# Patient Record
Sex: Male | Born: 1970 | Race: Black or African American | Hispanic: No | Marital: Single | Smoking: Never smoker
Health system: Southern US, Community
[De-identification: ages and names within clinical notes are randomized; demographics above are authoritative.]

## PROBLEM LIST (undated history)

## (undated) DIAGNOSIS — E559 Vitamin D deficiency, unspecified: Secondary | ICD-10-CM

## (undated) DIAGNOSIS — E538 Deficiency of other specified B group vitamins: Secondary | ICD-10-CM

## (undated) DIAGNOSIS — F259 Schizoaffective disorder, unspecified: Secondary | ICD-10-CM

## (undated) DIAGNOSIS — F319 Bipolar disorder, unspecified: Secondary | ICD-10-CM

---

## 2013-03-05 ENCOUNTER — Inpatient Hospital Stay (HOSPITAL_COMMUNITY)
Admission: EM | Admit: 2013-03-05 | Discharge: 2013-03-06 | DRG: 313 | Disposition: A | Discharge status: Home / Self Care | Payer: Self-pay | Attending: Internal Medicine | Admitting: Internal Medicine

## 2013-03-05 ENCOUNTER — Encounter (HOSPITAL_COMMUNITY): Payer: Self-pay | Admitting: Emergency Medicine

## 2013-03-05 ENCOUNTER — Emergency Department (HOSPITAL_COMMUNITY): Payer: Self-pay

## 2013-03-05 DIAGNOSIS — I4510 Unspecified right bundle-branch block: Secondary | ICD-10-CM | POA: Diagnosis present

## 2013-03-05 DIAGNOSIS — J189 Pneumonia, unspecified organism: Secondary | ICD-10-CM

## 2013-03-05 DIAGNOSIS — Z833 Family history of diabetes mellitus: Secondary | ICD-10-CM

## 2013-03-05 DIAGNOSIS — R0789 Other chest pain: Principal | ICD-10-CM | POA: Diagnosis present

## 2013-03-05 DIAGNOSIS — R079 Chest pain, unspecified: Secondary | ICD-10-CM | POA: Diagnosis present

## 2013-03-05 DIAGNOSIS — R0902 Hypoxemia: Secondary | ICD-10-CM | POA: Diagnosis present

## 2013-03-05 DIAGNOSIS — R071 Chest pain on breathing: Secondary | ICD-10-CM

## 2013-03-05 LAB — RAPID URINE DRUG SCREEN, HOSP PERFORMED
AMPHETAMINES: NOT DETECTED
BARBITURATES: NOT DETECTED
Benzodiazepines: NOT DETECTED
Cocaine: NOT DETECTED
Opiates: POSITIVE — AB
Tetrahydrocannabinol: NOT DETECTED

## 2013-03-05 LAB — POCT I-STAT TROPONIN I: Troponin i, poc: 0 ng/mL (ref 0.00–0.08)

## 2013-03-05 LAB — PRO B NATRIURETIC PEPTIDE: Pro B Natriuretic peptide (BNP): 7 pg/mL (ref 0–125)

## 2013-03-05 LAB — CBC
HEMATOCRIT: 43.6 % (ref 39.0–52.0)
HEMOGLOBIN: 15.1 g/dL (ref 13.0–17.0)
MCH: 29.3 pg (ref 26.0–34.0)
MCHC: 34.6 g/dL (ref 30.0–36.0)
MCV: 84.7 fL (ref 78.0–100.0)
Platelets: 320 10*3/uL (ref 150–400)
RBC: 5.15 MIL/uL (ref 4.22–5.81)
RDW: 12.8 % (ref 11.5–15.5)
WBC: 9.3 10*3/uL (ref 4.0–10.5)

## 2013-03-05 LAB — TROPONIN I: Troponin I: <0.3 ng/mL (ref <0.30)

## 2013-03-05 LAB — BASIC METABOLIC PANEL
BUN: 16 mg/dL (ref 6–23)
CALCIUM: 10.3 mg/dL (ref 8.4–10.5)
CO2: 25 mEq/L (ref 19–32)
CREATININE: 1.21 mg/dL (ref 0.50–1.35)
Chloride: 101 mEq/L (ref 96–112)
GFR, EST AFRICAN AMERICAN: 83 mL/min — AB (ref >90)
GFR, EST NON AFRICAN AMERICAN: 71 mL/min — AB (ref >90)
GLUCOSE: 91 mg/dL (ref 70–99)
Potassium: 4.2 mEq/L (ref 3.7–5.3)
Sodium: 140 mEq/L (ref 137–147)

## 2013-03-05 LAB — D-DIMER, QUANTITATIVE: D-Dimer, Quant: 0.29 ug/mL-FEU (ref 0.00–0.48)

## 2013-03-05 MED ORDER — MORPHINE SULFATE 4 MG/ML IJ SOLN
4.0000 mg | Freq: Once | INTRAMUSCULAR | Status: AC
  Administered 2013-03-05: 4 mg via INTRAVENOUS
  Filled 2013-03-05: qty 1

## 2013-03-05 MED ORDER — SODIUM CHLORIDE 0.9 % IJ SOLN
3.0000 mL | Freq: Two times a day (BID) | INTRAMUSCULAR | Status: DC
  Administered 2013-03-05: 3 mL via INTRAVENOUS

## 2013-03-05 MED ORDER — KETOROLAC TROMETHAMINE 30 MG/ML IJ SOLN
30.0000 mg | Freq: Three times a day (TID) | INTRAMUSCULAR | Status: DC
  Administered 2013-03-05 – 2013-03-06 (×2): 30 mg via INTRAVENOUS
  Filled 2013-03-05 (×5): qty 1

## 2013-03-05 MED ORDER — DEXTROSE 5 % IV SOLN: 500.0000 mg | Freq: Once | INTRAVENOUS | Status: DC

## 2013-03-05 MED ORDER — DEXTROSE 5 % IV SOLN
1.0000 g | Freq: Once | INTRAVENOUS | Status: AC
  Administered 2013-03-05: 1 g via INTRAVENOUS
  Filled 2013-03-05: qty 10

## 2013-03-05 MED ORDER — SODIUM CHLORIDE 0.9 % IV BOLUS (SEPSIS)
1000.0000 mL | Freq: Once | INTRAVENOUS | Status: AC
  Administered 2013-03-05: 1000 mL via INTRAVENOUS

## 2013-03-05 MED ORDER — AZITHROMYCIN 250 MG PO TABS: 500.0000 mg | ORAL_TABLET | Freq: Once | ORAL | Status: DC

## 2013-03-05 MED ORDER — ENOXAPARIN SODIUM 40 MG/0.4ML ~~LOC~~ SOLN
40.0000 mg | Freq: Every day | SUBCUTANEOUS | Status: DC
  Administered 2013-03-05: 40 mg via SUBCUTANEOUS
  Filled 2013-03-05 (×2): qty 0.4

## 2013-03-05 NOTE — ED Notes (Signed)
Pt reports left sided chest pain with shortness of breath onset yesterday. Pt reports pain increases with lying down. Pt talking in complete sentences without difficulty. Pt denies injury or recent cold symptoms.

## 2013-03-05 NOTE — ED Notes (Signed)
No blue cultures to be drawn per Dr. Criss AlvineGoldston.

## 2013-03-05 NOTE — H&P (Signed)
Date: 03/05/2013               Patient Name:  Seth Jordan MRN: [id number]  DOB: September 07, 1970 Age / Sex: 43 y.o., male   PCP: No Pcp Per Patient         Medical Service: Internal Medicine Teaching Service         Attending Physician: Dr. Inez Catalina, MD    First Contact: Dr. Alvina Chou Pager: [phone]  Second Contact: Dr. Dow Adolph Pager: [phone]       After Hours (After 5p/  First Contact Pager: [phone]  weekends / holidays): Second Contact Pager: [phone]   Chief Complaint: Chest pain  History of Present Illness:  Seth Jordan is a 43 y.o. male with no significant past medical history who presents to the ED with a chief complaint of chest pain.  Patient states he was sitting on the couch watching TV 2 days ago when he suddenly developed sharp, left-sided chest pain. The pain is located underneath his left breast and in the left upper part of his abdomen. It does not radiate. It is worse with movement and deep inspiration. He rates it 9/10. He says it feels like a pulled muscle, but he became concerned when it did not go way. He denies recent trauma. He does tell us that last week he did several 100 sit-ups for the first time in 15 years. He is on a diet and "trying to lose his gut."  He denies cough, shortness of breath. Denies fevers, as chills, rhinorrhea, congestion. Denies abdominal pain, nausea, vomiting. Denies leg swelling, orthopnea, PND. Denies hemoptysis. No recent travel. No personal or family history of blood clots. No personal or family history of heart disease.  He tells [place] he has no medical problems and takes no medications at home. He does not have a primary care doctor. 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 He is currently unemployed. He denies tobacco use, alcohol use, illicit drug use.  Patient was ambulated with a nurse in the ED. His oxygen saturation dropped to 84%, with respiratory rate between 28 and 38,  tachycardic to 105. He was saturating 97% on room air on our examination.  In the ED he was given morphine 4 mg IV x1, azithromycin 500 mg IV x1, ceftriaxone 1 g IV x1, and a 1 L normal saline bolus.   Meds: Current Facility-Administered Medications  Medication Dose Route Frequency Provider Last Rate Last Dose  . azithromycin (ZITHROMAX) 500 mg in dextrose 5 % 250 mL IVPB  500 mg Intravenous Once Audree Camel, MD      . cefTRIAXone (ROCEPHIN) 1 g in dextrose 5 % 50 mL IVPB  1 g Intravenous Once Audree Camel, MD       No current outpatient prescriptions on file.    Allergies: Allergies as of 03/05/2013  . (No Known Allergies)   History reviewed. No pertinent past medical history. History reviewed. No pertinent past surgical history. No family history on file. History   Social History  . Marital Status: Single    Spouse Name: N/A    Number of Children: N/A  . Years of Education: N/A   Occupational History  . Not on file.   Social History Main Topics  . Smoking status: Never Smoker   . Smokeless tobacco: Not on file  . Alcohol Use: No  . Drug Use: No  . Sexual Activity: Not  on file   Other Topics Concern  . Not on file   Social History Narrative  . No narrative on file    Review of Systems: Pertinent items are noted in HPI.  Physical Exam: Blood pressure 117/68, pulse 81, temperature 99.6 F (37.6 C), temperature source Oral, resp. rate 18, height 5\' 6"  (1.676 m), weight 170 lb (77.111 kg), SpO2 97.00%. Physical Exam  Constitutional: He is oriented to person, place, and time and well-developed, well-nourished, and in no distress.  HENT:  Head: Normocephalic and atraumatic.  Eyes: Conjunctivae and EOM are normal. Pupils are equal, round, and reactive to light.  Neck: Normal range of motion. Neck supple.  Cardiovascular: Normal rate, regular rhythm, normal heart sounds and intact distal pulses.  Exam reveals no gallop and no friction rub.   No murmur  heard. Pulmonary/Chest: Effort normal and breath sounds normal. No respiratory distress. He has no wheezes. He has no rales. He exhibits tenderness (Exquisitely tender to palpation of chest wall underneath the left breast).   Able to provide good inspirations for the pulmonary exam  Abdominal: Soft. Bowel sounds are normal. He exhibits no distension. There is tenderness (Tender to palpation in the left upper quadrant).  Musculoskeletal: Normal range of motion. He exhibits no edema and no tenderness.  Neurological: He is alert and oriented to person, place, and time. No cranial nerve deficit. GCS score is 15.  Skin: Skin is warm and dry. He is not diaphoretic.  Psychiatric: Affect normal.     Lab results: Basic Metabolic Panel:  Recent Labs  84/13/2401/31/15 1428  NA 140  K 4.2  CL 101  CO2 25  GLUCOSE 91  BUN 16  CREATININE 1.21  CALCIUM 10.3   Liver Function Tests: No results found for this basename: AST, ALT, ALKPHOS, BILITOT, PROT, ALBUMIN,  in the last 72 hours No results found for this basename: LIPASE, AMYLASE,  in the last 72 hours No results found for this basename: AMMONIA,  in the last 72 hours CBC:  Recent Labs  03/05/13 1428  WBC 9.3  HGB 15.1  HCT 43.6  MCV 84.7  PLT 320   Cardiac Enzymes: No results found for this basename: CKTOTAL, CKMB, CKMBINDEX, TROPONINI,  in the last 72 hours BNP:  Recent Labs  03/05/13 1425  PROBNP 7.0   D-Dimer:  Recent Labs  03/05/13 1515  DDIMER 0.29   CBG: No results found for this basename: GLUCAP,  in the last 72 hours Hemoglobin A1C: No results found for this basename: HGBA1C,  in the last 72 hours Fasting Lipid Panel: No results found for this basename: CHOL, HDL, LDLCALC, TRIG, CHOLHDL, LDLDIRECT,  in the last 72 hours Thyroid Function Tests: No results found for this basename: TSH, T4TOTAL, FREET4, T3FREE, THYROIDAB,  in the last 72 hours Anemia Panel: No results found for this basename: VITAMINB12, FOLATE,  FERRITIN, TIBC, IRON, RETICCTPCT,  in the last 72 hours Coagulation: No results found for this basename: LABPROT, INR,  in the last 72 hours Urine Drug Screen: Drugs of Abuse  No results found for this basename: labopia, cocainscrnur, labbenz, amphetmu, thcu, labbarb    Alcohol Level: No results found for this basename: ETH,  in the last 72 hours Urinalysis: No results found for this basename: COLORURINE, APPERANCEUR, LABSPEC, PHURINE, GLUCOSEU, HGBUR, BILIRUBINUR, KETONESUR, PROTEINUR, UROBILINOGEN, NITRITE, LEUKOCYTESUR,  in the last 72 hours   Imaging results:  Dg Chest 2 View (if Patient Has Fever And/or Copd)  03/05/2013   CLINICAL DATA:  Left lower chest pain for 2 days  EXAM: CHEST  2 VIEW  COMPARISON:  None  FINDINGS: Heart size and vascular pattern normal. Limited inspiratory. Mild lower lobe infiltrate on the right. More prominent infiltrate left lower lobe. No pleural effusions.  IMPRESSION: Bilateral lower lobe infiltrates left more prominent than right. The right-sided infiltrate could be due to atelectasis given the limited inspiratory effect. The left side appears to be out of proportion for atelectasis an would suggest pneumonia or pneumonitis.   Electronically Signed   By: Esperanza Heir M.D.   On: 03/05/2013 14:11    Other results: EKG: there are no previous tracings available for comparison, RBBB.  Assessment & Plan by Problem: Seth Jordan is a 43 y.o. male with no significant past medical history who presents to the ED with a chief complaint of chest pain.  #Musculoskeletal chest wall pain - Patient presents with pleuritic pain underneath his left breast. Physical exam and history is most consistent with musculoskeletal pain. Notably, he became hypoxic to 84% with ambulation in the ED. Chest x-ray showed bilateral lower lobe infiltrates, left greater than right, atelectasis versus pneumonia. However he had a notably poor inspiratory effort secondary to pain. Clinically,  he does not appear to have pneumonia. Lungs are clear to auscultation. AVSS. He has no associated symptoms such as cough, shortness of breath. PE was considered. D-dimer was checked and 0.29 (wnl). ProBNP 7.0 (wnl). Point-of-care troponin was negative.  - Admit to IMTS, telemetry - He is s/p azithromycin 500 mg IV x1, ceftriaxone 1 g IV x1, but we don't feel strongly that antibiotics need to be continued at this time - Encourage incentive spirometry - Repeat CXR in am - Trending troponins x3 - Urine drug screen - Continuous pulse ox - Plan to ambulate with pulse ox in am - Toradol 30 mg IV 3 times a day for pain - Regular diet - CBC, CMP, PT/INR in am  #Right bundle blanch block - Noted on EKG, it is unknown if this is new or old (no prior EKG for comparison). Otherwise, there are no concerning ST or T wave changes. Differential includes PE (but D-dimer was negative), ischemic heart disease, myocarditis, cardiomyopathy. - Trending troponins as above - Repeat EKG in a.m. - 2-D echocardiogram tomorrow  #Health maintenance - Patient does not have a primary care doctor. He has a family history of diabetes in his mother. - Hemoglobin A1c - HIV antibody - Fasting lipid panel - Care management consult in am for medication needs, follow up  #DVT PPX - Lovenox subcutaneous  Dispo: Disposition is deferred at this time, awaiting improvement of current medical problems. Anticipated discharge in approximately 1-3 day(s).   The patient does not have a current PCP (No Pcp Per Patient) and does not need an [facility] hospital follow-up appointment after discharge.  The patient does not have transportation limitations that hinder transportation to clinic appointments.  Signed: Vivi Barrack, MD 03/05/2013, 5:02 PM

## 2013-03-05 NOTE — ED Notes (Signed)
Pt ambulated around pod x 1.  Pt O2 sats dropped to 84%, respiratory rate between 28-38, HR as high as 105.  Pt's O2 sats rose to 91% and maintained till pt returned to room.  Pt denied any shortness of breath but stated he was having pain with ambulation.

## 2013-03-05 NOTE — ED Provider Notes (Signed)
CSN: [id number]     Arrival date & time 03/05/13  1340 History   First MD Initiated Contact with Patient 03/05/13 1456     Chief Complaint  Patient presents with  . Chest Pain  . Shortness of Breath   (Consider location/radiation/quality/duration/timing/severity/associated sxs/prior Treatment) HPI Comments: 43 year old male presents with 2 days of sharp left-sided chest pain. States it is pleuritic in nature. He states the pain as a 9/10. He states it originally felt like a cramp or a pulled muscle but is continued. He's not have shortness of breath but is pain worsens with breathing. He's had a little bit of a dry cough but states that it occurred after the pain started. He has trouble communicating when the cough started. Denies fevers or congestion. No hemoptysis. He's never had symptoms like this before. No abdominal symptoms. No other infectious symptoms. Patient not any leg swelling, recent surgery or travel, or history of blood clot. Of note, the patient is a poor historian.   History reviewed. No pertinent past medical history. History reviewed. No pertinent past surgical history. No family history on file. History  Substance Use Topics  . Smoking status: Never Smoker   . Smokeless tobacco: Not on file  . Alcohol Use: No    Review of Systems  Constitutional: Negative for fever and chills.  HENT: Positive for sneezing. Negative for congestion.   Respiratory: Positive for cough (since after the pain started). Negative for shortness of breath (hurts to breathe but not short of breath).   Cardiovascular: Positive for chest pain.  Gastrointestinal: Negative for vomiting and abdominal pain.  Musculoskeletal: Negative for back pain.  All other systems reviewed and are negative.    Allergies  Review of patient's allergies indicates no known allergies.  Home Medications  No current outpatient prescriptions on file. BP 117/68  Pulse 81  Temp(Src) 99.6 F (37.6 C) (Oral)  Resp  18  Ht 5\' 6"  (1.676 m)  Wt 170 lb (77.111 kg)  BMI 27.45 kg/m2  SpO2 97% Physical Exam  Nursing note and vitals reviewed. Constitutional: He is oriented to person, place, and time. He appears well-developed and well-nourished.  HENT:  Head: Normocephalic and atraumatic.  Right Ear: External ear normal.  Left Ear: External ear normal.  Nose: Nose normal.  Eyes: Right eye exhibits no discharge. Left eye exhibits no discharge.  Neck: Neck supple.  Cardiovascular: Normal rate, regular rhythm, normal heart sounds and intact distal pulses.   Pulmonary/Chest: No respiratory distress. He has no wheezes. He exhibits tenderness.  Decreased inspiratory effort due to pain makes evaluation difficult. No obvious wheezes or rales  Abdominal: Soft. There is no tenderness.  Musculoskeletal: He exhibits no edema.  Neurological: He is alert and oriented to person, place, and time.  Skin: Skin is warm and dry.    ED Course  Procedures (including critical care time) Labs Review Labs Reviewed  BASIC METABOLIC PANEL - Abnormal; Notable for the following:    GFR calc non Af Amer 71 (*)    GFR calc Af Amer 83 (*)    All other components within normal limits  CBC  PRO B NATRIURETIC PEPTIDE  D-DIMER, QUANTITATIVE  POCT I-STAT TROPONIN I   Imaging Review Dg Chest 2 View (if Patient Has Fever And/or Copd)  03/05/2013   CLINICAL DATA:  Left lower chest pain for 2 days  EXAM: CHEST  2 VIEW  COMPARISON:  None  FINDINGS: Heart size and vascular pattern normal. Limited inspiratory. Mild lower lobe  infiltrate on the right. More prominent infiltrate left lower lobe. No pleural effusions.  IMPRESSION: Bilateral lower lobe infiltrates left more prominent than right. The right-sided infiltrate could be due to atelectasis given the limited inspiratory effect. The left side appears to be out of proportion for atelectasis an would suggest pneumonia or pneumonitis.   Electronically Signed   By: Esperanza Heiraymond  Rubner M.D.    On: 03/05/2013 14:11    EKG Interpretation    Date/Time:  Saturday March 05 2013 13:59:31 EST Ventricular Rate:  76 PR Interval:  154 QRS Duration: 142 QT Interval:  372 QTC Calculation: 418 R Axis:   -46 Text Interpretation:  Normal sinus rhythm Left axis deviation Right bundle branch block Abnormal ECG No old tracing to compare Confirmed by Alylah Blakney  MD, Lynzi Meulemans ([id number]) on 03/05/2013 2:56:38 PM            MDM   1. Community acquired pneumonia    Patient's chest pain moderately controlled with one dose of IV morphine. He has no hypoxia at rest and does not appear overtly short of breath. After morphine he was reexamined and he has some crackles in both bases. He was ambulated with pulse oximetry and noted to have desaturation to 84% and became tachypneic. Due to this I do not feel he is safe to go home. We'll give IV antibiotics for community-acquired pneumonia and admit to internal medicine. He is low risk for PE and has a negative d-dimer, thus I do not feel he needs CT imaging at this point.    Audree CamelScott T Beverly Ferner, MD 03/05/13 [phone]

## 2013-03-06 ENCOUNTER — Inpatient Hospital Stay (HOSPITAL_COMMUNITY): Payer: Self-pay

## 2013-03-06 DIAGNOSIS — I4510 Unspecified right bundle-branch block: Secondary | ICD-10-CM | POA: Diagnosis present

## 2013-03-06 LAB — COMPREHENSIVE METABOLIC PANEL
ALK PHOS: 52 U/L (ref 39–117)
ALT: 23 U/L (ref 0–53)
AST: 21 U/L (ref 0–37)
Albumin: 3.1 g/dL — ABNORMAL LOW (ref 3.5–5.2)
BUN: 19 mg/dL (ref 6–23)
CHLORIDE: 103 meq/L (ref 96–112)
CO2: 22 mEq/L (ref 19–32)
CREATININE: 1.37 mg/dL — AB (ref 0.50–1.35)
Calcium: 9.1 mg/dL (ref 8.4–10.5)
GFR calc Af Amer: 71 mL/min — ABNORMAL LOW (ref >90)
GFR, EST NON AFRICAN AMERICAN: 61 mL/min — AB (ref >90)
GLUCOSE: 92 mg/dL (ref 70–99)
Potassium: 4.1 mEq/L (ref 3.7–5.3)
Sodium: 141 mEq/L (ref 137–147)
Total Bilirubin: 0.3 mg/dL (ref 0.3–1.2)
Total Protein: 6.5 g/dL (ref 6.0–8.3)

## 2013-03-06 LAB — LIPID PANEL
CHOLESTEROL: 136 mg/dL (ref 0–200)
HDL: 36 mg/dL — ABNORMAL LOW (ref >39)
LDL Cholesterol: 81 mg/dL (ref 0–99)
Total CHOL/HDL Ratio: 3.8 RATIO
Triglycerides: 93 mg/dL (ref <150)
VLDL: 19 mg/dL (ref 0–40)

## 2013-03-06 LAB — TROPONIN I
Troponin I: <0.3 ng/mL (ref <0.30)
Troponin I: <0.3 ng/mL (ref <0.30)

## 2013-03-06 LAB — PROTIME-INR
INR: 0.92 (ref 0.00–1.49)
PROTHROMBIN TIME: 12.2 s (ref 11.6–15.2)

## 2013-03-06 LAB — APTT: aPTT: 35 seconds (ref 24–37)

## 2013-03-06 LAB — HIV ANTIBODY (ROUTINE TESTING W REFLEX): HIV: NONREACTIVE

## 2013-03-06 LAB — HEMOGLOBIN A1C
Hgb A1c MFr Bld: 6 % — ABNORMAL HIGH (ref <5.7)
MEAN PLASMA GLUCOSE: 126 mg/dL — AB (ref <117)

## 2013-03-06 MED ORDER — IBUPROFEN 600 MG PO TABS: 600.0000 mg | ORAL_TABLET | Freq: Four times a day (QID) | ORAL | Status: DC | PRN

## 2013-03-06 NOTE — Discharge Summary (Signed)
Name: Seth Jordan MRN: [id number] DOB: March 30, 1970 43 y.o. PCP: No Pcp Per Patient . Date of Admission: 03/05/2013  2:53 PM Date of Discharge: 03/06/2013 Attending Physician: Inez Catalina, MD  Discharge Diagnosis: Principal Problem:   Musculoskeletal chest pain Active Problems:   Right bundle branch block (RBBB) on electrocardiogram (ECG)  Discharge Medications:   Medication List         ibuprofen 600 MG tablet  Commonly known as:  ADVIL  Take 1 tablet (600 mg total) by mouth every 6 (six) hours as needed for moderate pain.        Disposition and follow-up:   Mr.Jamerius Klarich was discharged from [facility] in Good condition.  Please address the following problems:  1.  Resolution of chest pain and any further episodes  2.  Labs / imaging needed at time of follow-up: BMP (creatitine elevated during admission-verify trending down)  3.  Pending labs/ test needing follow-up: None   Follow-up Appointments:     Follow-up Information   Schedule an appointment as soon as possible for a visit with [facility].   Contact information:   [address] [place] [address] [phone]      Discharge Instructions: Discharge Orders   Future Orders Complete By Expires   Call MD for:  extreme fatigue  As directed    Diet - low sodium heart healthy  As directed    Increase activity slowly  As directed       Consultations:  None  Procedures Performed:  Dg Chest 2 View  03/06/2013   CLINICAL DATA:  Chest pain, shortness of breath, pneumonia  EXAM: CHEST  2 VIEW  COMPARISON:  03/05/2013  FINDINGS: Mild enlargement of cardiac silhouette.  Mediastinal contours and pulmonary vascularity normal.  Mild right basilar atelectasis.  Atelectasis versus consolidation in left lower lobe.  Basilar findings are similar to the previous exam.  Upper lungs clear.  Small left pleural effusion.  No pneumothorax or acute osseous findings.  IMPRESSION:  Right basilar atelectasis.  Atelectasis versus consolidation left lower lobe.  Small left pleural effusion.   Electronically Signed   By: Ulyses Southward M.D.   On: 03/06/2013 12:25   Dg Chest 2 View (if Patient Has Fever And/or Copd)  03/05/2013   CLINICAL DATA:  Left lower chest pain for 2 days  EXAM: CHEST  2 VIEW  COMPARISON:  None  FINDINGS: Heart size and vascular pattern normal. Limited inspiratory. Mild lower lobe infiltrate on the right. More prominent infiltrate left lower lobe. No pleural effusions.  IMPRESSION: Bilateral lower lobe infiltrates left more prominent than right. The right-sided infiltrate could be due to atelectasis given the limited inspiratory effect. The left side appears to be out of proportion for atelectasis an would suggest pneumonia or pneumonitis.   Electronically Signed   By: Esperanza Heir M.D.   On: 03/05/2013 14:11    2D Echo: N/A  Cardiac Cath: N/A  Admission HPI: Seth Jordan is a 43 y.o. male with no significant past medical history who presents to the ED with a chief complaint of chest pain.  Patient states he was sitting on the couch watching TV 2 days ago when he suddenly developed sharp, left-sided chest pain. The pain is located underneath his left breast and in the left upper part of his abdomen. It does not radiate. It is worse with movement and deep inspiration. He rates it 9/10. He says it feels like a  pulled muscle, but he became concerned when it did not go way. He denies recent trauma. He does tell Seth Jordan that last week he did several 100 sit-ups for the first time in 15 years. He is on a diet and "trying to lose his gut."  He denies cough, shortness of breath. Denies fevers, as chills, rhinorrhea, congestion. Denies abdominal pain, nausea, vomiting. Denies leg swelling, orthopnea, PND. Denies hemoptysis. No recent travel. No personal or family history of blood clots. No personal or family history of heart disease.  He tells Seth Jordan he has no medical problems and  takes no medications at home. He does not have a primary care doctor. 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 He is currently unemployed. He denies tobacco use, alcohol use, illicit drug use.  Patient was ambulated with a nurse in the ED. His oxygen saturation dropped to 84%, with respiratory rate between 28 and 38, tachycardic to 105. He was saturating 97% on room air on our examination.   In the ED he was given morphine 4 mg IV x1, azithromycin 500 mg IV x1, ceftriaxone 1 g IV x1, and a 1 L normal saline bolus.   Hospital Course by problem list: Principal Problem:   Musculoskeletal chest pain Active Problems:   Right bundle branch block (RBBB) on electrocardiogram (ECG)   #Atypical chest pain - Patient presents with pleuritic pain underneath his left breast. Physical exam and history is most consistent with musculoskeletal pain. Notably, he became hypoxic to 84% with ambulation in the ED. Chest x-ray showed bilateral lower lobe infiltrates, left greater than right, atelectasis versus pneumonia. However he had notably poor inspiratory effort secondary to pain. Clinically, he did not appear to have pneumonia. Lungs were clear to auscultation. AVSS. He has no associated symptoms such as cough, shortness of breath. PE was considered. D-dimer was checked and 0.29 (wnl). ProBNP 7.0 (wnl).  Point-of-care troponin was negative.  He was admitted to the IMTS and placed on telemetry.  He was given a dose of  azithromycin 500 mg IV x1, ceftriaxone 1 g IV x1, but we did not feel strongly that antibiotics need to be continued due to clinical presentation.  He was provided with incentive spirometry.  Repeat CXR showed right basilar atelectasis and atelectasis versus consolidation of LLL with a small left pleural effusion.  Troponins were trended and all were wnl.   Urine drug screen was positive for opiates; it is unclear if he has a prescription for this.  Consider repeat CXR in  6 weeks to confirm resolution of findings.    #Incomplete right bundle blanch block - Noted on EKG, it is unknown if this is new or old (no prior EKG for comparison). Otherwise, there are no concerning ST or T wave changes. Differential includes PE (D-dimer negative), ischemic heart disease, myocarditis, cardiomyopathy.  We trended troponins which were all wnl.  Repeat EKG did not show any significant changes.  Should consider transthoracic echocardiogram and further cardiac workup as outpatient.  No premature CAD noted in family history.    #Health maintenance - Patient does not have a primary care doctor. He has a family history of diabetes in his mother.  HA1c was 6.0 which classifies him as being "prediabetic".  He should be counseled to exercise and weight loss.  May consider putting him on metformin.  HIV antibody negative.  Lipid panel shows low HDL at 36.     Discharge Vitals:  BP 130/73  Pulse 79  Temp(Src) 98.3 F (36.8 C) (Oral)  Resp 18  Ht 5\' 6"  (1.676 m)  Wt 170 lb (77.111 kg)  BMI 27.45 kg/m2  SpO2 98%  Discharge Labs:  Results for orders placed during the hospital encounter of 03/05/13 (from the past 24 hour(s))  POCT I-STAT TROPONIN I     Status: None   Collection Time    03/05/13  2:36 PM      Result Value Range   Troponin i, poc 0.00  0.00 - 0.08 ng/mL   Comment 3           D-DIMER, QUANTITATIVE     Status: None   Collection Time    03/05/13  3:15 PM      Result Value Range   D-Dimer, Quant 0.29  0.00 - 0.48 ug/mL-FEU  TROPONIN I     Status: None   Collection Time    03/05/13  8:10 PM      Result Value Range   Troponin I <0.30  <0.30 ng/mL  HIV ANTIBODY (ROUTINE TESTING)     Status: None   Collection Time    03/05/13  8:10 PM      Result Value Range   HIV NON REACTIVE  NON REACTIVE  URINE RAPID DRUG SCREEN (HOSP PERFORMED)     Status: Abnormal   Collection Time    03/05/13  9:54 PM      Result Value Range   Opiates POSITIVE (*) NONE DETECTED   Cocaine  NONE DETECTED  NONE DETECTED   Benzodiazepines NONE DETECTED  NONE DETECTED   Amphetamines NONE DETECTED  NONE DETECTED   Tetrahydrocannabinol NONE DETECTED  NONE DETECTED   Barbiturates NONE DETECTED  NONE DETECTED  TROPONIN I     Status: None   Collection Time    03/06/13  6:00 AM      Result Value Range   Troponin I <0.30  <0.30 ng/mL  COMPREHENSIVE METABOLIC PANEL     Status: Abnormal   Collection Time    03/06/13  6:00 AM      Result Value Range   Sodium 141  137 - 147 mEq/L   Potassium 4.1  3.7 - 5.3 mEq/L   Chloride 103  96 - 112 mEq/L   CO2 22  19 - 32 mEq/L   Glucose, Bld 92  70 - 99 mg/dL   BUN 19  6 - 23 mg/dL   Creatinine, Ser 1.61 (*) 0.50 - 1.35 mg/dL   Calcium 9.1  8.4 - 09.6 mg/dL   Total Protein 6.5  6.0 - 8.3 g/dL   Albumin 3.1 (*) 3.5 - 5.2 g/dL   AST 21  0 - 37 U/L   ALT 23  0 - 53 U/L   Alkaline Phosphatase 52  39 - 117 U/L   Total Bilirubin 0.3  0.3 - 1.2 mg/dL   GFR calc non Af Amer 61 (*) >90 mL/min   GFR calc Af Amer 71 (*) >90 mL/min  PROTIME-INR     Status: None   Collection Time    03/06/13  6:00 AM      Result Value Range   Prothrombin Time 12.2  11.6 - 15.2 seconds   INR 0.92  0.00 - 1.49  APTT     Status: None   Collection Time    03/06/13  6:00 AM      Result Value Range   aPTT 35  24 - 37 seconds  LIPID PANEL  Status: Abnormal   Collection Time    03/06/13  6:00 AM      Result Value Range   Cholesterol 136  0 - 200 mg/dL   Triglycerides 93  <161 mg/dL   HDL 36 (*) >09 mg/dL   Total CHOL/HDL Ratio 3.8     VLDL 19  0 - 40 mg/dL   LDL Cholesterol 81  0 - 99 mg/dL  TROPONIN I     Status: None   Collection Time    03/06/13  9:20 AM      Result Value Range   Troponin I <0.30  <0.30 ng/mL    Signed: Boykin Peek, MD [phone] 03/06/2013, 2:28 PM   Time Spent on Discharge: Services Ordered on Discharge: None Equipment Ordered on Discharge: None

## 2013-03-06 NOTE — H&P (Signed)
  Date: 03/06/2013  Patient name: Seth Jordan  Medical record number: [id number]  Date of birth: 07/22/1970   I have seen and evaluated Seth Jordan and discussed their care with the Residency Team. Briefly, Mr. Seth Jordan is a 43yo man without significant PMH who presented with CP.  The CP developed suddenly while at rest, under the left breast and axilla, sharp, no radiation, worse with movement or inspiration.  9/10 at the worst.  Feels like a "pulled muscle."  NO recent trauma, but recent increase in work out routine.  Other ROS is negative.  He has no family history of CAD.  He is not a smoker. He was tachycardic and mildly hypoxic in the ED with activity, but this had not been reported before.  On exam, pain was reproducible.  First CE was negative.  CXR had poor inspiration and possible infiltrate. On EKG he had a partial RBBB, unclear how old as he has not had one before.   Assessment and Plan: I have seen and evaluated the patient as outlined above. I agree with the formulated Assessment and Plan as detailed in the residents' admission note, with the following changes:   1. Atypical chest pain, likely MSK: Given his age, male gender and symptoms in the ED, will admit and rule out for ACS.  It does not appear to be a pneumonia as Mr. Seth Jordan was not able to take a deep breath due to pain.  Repeat CXR, Cycle TnI, Toradol for pain, ambulate with pulse ox, trend labs.  This is likely to resolve on its own, have low suspicion at this time for ACS.   2. RBBB, partial: Plan for TTE, repeat EKG, trend CE  Other issues per resident note.   Inez CatalinaEmily B Mullen, MD 2/1/201511:13 AM

## 2013-03-06 NOTE — Progress Notes (Signed)
Ambulated patient on room air for 300 ft. O2 saturation was 96 to 97%. Pulse was from 98 to 102.  Peter Congoiane Piper Hassebrock RN

## 2013-03-06 NOTE — Progress Notes (Signed)
Subjective:   Pt has no new complaints this AM.  Pt is feeling better and did not have any events overnight.  Pt denies any fever/chills, CP, SOB, or N/V/D/C.  Objective:   Vital signs in last 24 hours: Filed Vitals:   03/05/13 1802 03/05/13 1910 03/05/13 2242 03/06/13 0558  BP:  120/75 109/71 120/63  Pulse:  69 76 66  Temp: 98.7 F (37.1 C) 97.8 F (36.6 C) 98.1 F (36.7 C) 97.9 F (36.6 C)  TempSrc: Oral Oral Oral Oral  Resp:  18 18 18   Height:  5\' 6"  (1.676 m)    Weight:  170 lb (77.111 kg)    SpO2:  98% 96% 97%   Weight change:   Intake/Output Summary (Last 24 hours) at 03/06/13 1140 Last data filed at 03/06/13 0933  Gross per 24 hour  Intake    120 ml  Output      0 ml  Net    120 ml    Physical Exam: Constitutional: Vital signs reviewed.  Patient is in no acute distress and cooperative with exam.   Head: Normocephalic and atraumatic Eyes: PERRL, EOMI, conjunctivae normal, no scleral icterus  Neck: Supple, Trachea midline Cardiovascular: RRR, S1, S2 present, no MRG, DP 2+ b/l Pulmonary/Chest: normal respiratory effort, CTAB, no wheezes, rales, or rhonchi Abdominal: Soft. +BS, Non-tender, non-distended Musculoskeletal: No joint deformities, erythema, or stiffness Neurological: A&O x3, cranial nerve II-XII are grossly intact, moving all extremities  Skin: Warm, dry and intact. No rash Psychiatric: Normal mood and affect.   Lab Results:  BMP:  Recent Labs Lab 03/05/13 1428 03/06/13 0600  NA 140 141  K 4.2 4.1  CL 101 103  CO2 25 22  GLUCOSE 91 92  BUN 16 19  CREATININE 1.21 1.37*  CALCIUM 10.3 9.1    CBC:  Recent Labs Lab 03/05/13 1428  WBC 9.3  HGB 15.1  HCT 43.6  MCV 84.7  PLT 320    Coagulation:  Recent Labs Lab 03/06/13 0600  LABPROT 12.2  INR 0.92    CBG:           No results found for this basename: GLUCAP,  in the last 168 hours         HA1C:      No results found for this basename: HGBA1C,  in the last 168  hours  Lipid Panel:  Recent Labs Lab 03/06/13 0600  CHOL 136  HDL 36*  LDLCALC 81  TRIG 93  CHOLHDL 3.8    LFTs:  Recent Labs Lab 03/06/13 0600  AST 21  ALT 23  ALKPHOS 52  BILITOT 0.3  PROT 6.5  ALBUMIN 3.1*    Pancreatic Enzymes: No results found for this basename: LIPASE, AMYLASE,  in the last 168 hours  Ammonia: No results found for this basename: AMMONIA,  in the last 168 hours  Cardiac Enzymes:  Recent Labs Lab 03/05/13 2010 03/06/13 0600 03/06/13 0920  TROPONINI <0.30 <0.30 <0.30   Lab Results  Component Value Date   TROPONINI <0.30 03/06/2013    EKG:  Date/Time:  Saturday March 05 2013 13:59:31 EST Ventricular Rate:  76 PR Interval:  154 QRS Duration: 142 QT Interval:  372 QTC Calculation: 418 R Axis:   -46 Text Interpretation:  Normal sinus rhythm Left axis deviation Right bundle branch block Abnormal ECG No old tracing to compare Confirmed by GOLDSTON  MD, SCOTT ([id number]) on 03/05/2013 2:56:38 PM  BNP:  Recent Labs Lab 03/05/13 1425  PROBNP 7.0    D-Dimer:  Recent Labs Lab 03/05/13 1515  DDIMER 0.29    Urinalysis: No results found for this basename: COLORURINE, APPERANCEUR, LABSPEC, PHURINE, GLUCOSEU, HGBUR, BILIRUBINUR, KETONESUR, PROTEINUR, UROBILINOGEN, NITRITE, LEUKOCYTESUR,  in the last 168 hours  Micro Results: No results found for this or any previous visit (from the past 240 hour(s)).  Blood Culture: No results found for this basename: sdes, specrequest, cult, reptstatus    Studies/Results: Dg Chest 2 View (if Patient Has Fever And/or Copd)  03/05/2013   CLINICAL DATA:  Left lower chest pain for 2 days  EXAM: CHEST  2 VIEW  COMPARISON:  None  FINDINGS: Heart size and vascular pattern normal. Limited inspiratory. Mild lower lobe infiltrate on the right. More prominent infiltrate left lower lobe. No pleural effusions.  IMPRESSION: Bilateral lower lobe infiltrates left more prominent than right. The right-sided  infiltrate could be due to atelectasis given the limited inspiratory effect. The left side appears to be out of proportion for atelectasis an would suggest pneumonia or pneumonitis.   Electronically Signed   By: Esperanza Heir M.D.   On: 03/05/2013 14:11    Medications:  Scheduled Meds: . enoxaparin (LOVENOX) injection  40 mg Subcutaneous QHS  . ketorolac  30 mg Intravenous Q8H  . sodium chloride  3 mL Intravenous Q12H   Continuous Infusions:  PRN Meds:.  Antibiotics: Anti-infectives   Start     Dose/Rate Route Frequency Ordered Stop   03/05/13 1645  azithromycin (ZITHROMAX) tablet 500 mg  Status:  Discontinued     500 mg Oral  Once 03/05/13 1631 03/05/13 1643   03/05/13 1645  cefTRIAXone (ROCEPHIN) 1 g in dextrose 5 % 50 mL IVPB     1 g 100 mL/hr over 30 Minutes Intravenous  Once 03/05/13 1643 03/05/13 1748   03/05/13 1645  azithromycin (ZITHROMAX) 500 mg in dextrose 5 % 250 mL IVPB  Status:  Discontinued     500 mg 250 mL/hr over 60 Minutes Intravenous  Once 03/05/13 1643 03/05/13 1752     Antibiotics Given (last 72 hours)   None      Day of Hospitalization:  1 Consults:    Assessment/Plan:   43 yo male who presents with acute chest pain.   #Atypical chest pain- Pt reports his CP has completely resolved this AM with toradol and morphine.  He denies any SOB.  On exam, chest is non-tender to palpation and RRR.  Troponins x 3 negative.  EKG reveals incomplete RBBB.  2 View CXR reveals  bilateral lower lobe infiltrates left more than right-with the right-sided infiltrate could be d/t atelectasis given the limited inspiratory effect. The left side appears to be out of proportion for atelectasis an would suggest pneumonia or pneumonitis.  However, pt is afebrile without cough, SOB, or leukocytosis.  He ambulated 353ft with O2sat 96-97%RA this AM.   -continue toradol 30mg  tid -repeat STAT CXR  #Incomplete RBBB- Pt has EKG with incomplete RBBB.  However, he is without CP, SOB,  cough.  No FH of CAD.  No previous EKG for comparison.  May be a normal variant.  -TTE as outpatient   #Dispo- Pt to be discharged today pending results of CXR.     LOS: 1 day   Boykin Peek, MD PGY-1, Internal Medicine  [phone] 03/06/2013, 11:40 AM

## 2013-03-06 NOTE — Progress Notes (Signed)
Seth Jordan to be D/C'd Home per MD order.  Discussed with the patient and all questions fully answered.    Medication List         ibuprofen 600 MG tablet  Commonly known as:  ADVIL  Take 1 tablet (600 mg total) by mouth every 6 (six) hours as needed for moderate pain.        VVS, Skin clean, dry and intact without evidence of skin break down, no evidence of skin tears noted. IV catheter discontinued intact. Site without signs and symptoms of complications. Dressing and pressure applied.  An After Visit Summary was printed and given to the patient. Follow up appointments , new prescriptions and medication administration times given. Handout on pneumonia given and discussed with patient. Patient escorted via WC, and D/C home via private auto.  Cindra EvesCelano, Maddux First [place], RN 03/06/2013 2:43 PM

## 2013-03-07 NOTE — Progress Notes (Signed)
UR completed. Truth Barot RN CCM Case Mgmt 

## 2013-03-14 ENCOUNTER — Encounter: Payer: Self-pay | Admitting: Internal Medicine

## 2013-03-14 ENCOUNTER — Ambulatory Visit (INDEPENDENT_AMBULATORY_CARE_PROVIDER_SITE_OTHER): Payer: Self-pay | Admitting: Internal Medicine

## 2013-03-14 VITALS — BP 110/78 | HR 75 | Temp 98.3°F | Ht 66.0 in | Wt 183.9 lb

## 2013-03-14 DIAGNOSIS — N179 Acute kidney failure, unspecified: Secondary | ICD-10-CM

## 2013-03-14 DIAGNOSIS — R079 Chest pain, unspecified: Secondary | ICD-10-CM | POA: Insufficient documentation

## 2013-03-14 LAB — BASIC METABOLIC PANEL WITH GFR
BUN: 16 mg/dL (ref 6–23)
CALCIUM: 9.8 mg/dL (ref 8.4–10.5)
CHLORIDE: 103 meq/L (ref 96–112)
CO2: 24 meq/L (ref 19–32)
Creat: 1.28 mg/dL (ref 0.50–1.35)
GFR, EST NON AFRICAN AMERICAN: 68 mL/min
GFR, Est African American: 78 mL/min
Glucose, Bld: 83 mg/dL (ref 70–99)
Potassium: 4.1 mEq/L (ref 3.5–5.3)
SODIUM: 137 meq/L (ref 135–145)

## 2013-03-14 NOTE — Assessment & Plan Note (Signed)
Patient's chest pain has resolved completely. Not any new complaints. No further treatment needed. Patient was found to have acute renal injury in the hospital with creatinine 1.37.   -The patient was instructed to stop taking ibuprofen -will check BMP for renal function today.

## 2013-03-14 NOTE — Patient Instructions (Signed)
1. Please stop taking ibuprofen. You can take Tylenol for minor pain.  2.  If you have new symptoms arise, please call the clinic [phone]), or go to the ER immediately if symptoms are severe.   Please bring in all your medication bottles with you in next visit.

## 2013-03-14 NOTE — Discharge Summary (Signed)
I saw Mr. Seth Jordan on day of discharge and assisted in the discharge planning.

## 2013-03-14 NOTE — Progress Notes (Signed)
Patient ID: Seth Jordan, male   DOB: [birth year], 43 y.o.   MRN: [id number] Subjective:   Patient ID: Seth Jordan male   DOB: [birth year] 43 y.o.   MRN: [id number]  CC:   Hospital followup visit.       HPI:  [name] is a 43 y.o. man with past medical history as outlined below, who presents for a hospital followup visit today.  Patient was hospitalized from 01/31-03/06/13 because of chest pain. He had negative workup in the hospital, including d-dimer, troponin x3, pro BNP. LDL was 81 and A1c was 6.0.  He was initially found to have possible pneumonia by chest x-ray, and treated with one dose of azithromycin and once does of ceftriaxone, which were discontinued after repeated chest x-ray was negative. He was discharged in stable condition. Today he reports that all his symptoms have completely resolved. He does not have cough, chest pain, shortness of breath, fever, chills. He feels normal.   ROS:  Denies fever, chills, fatigue, headaches, cough, chest pain, SOB,  abdominal pain,diarrhea, constipation, dysuria, urgency, frequency, hematuria, joint pain or leg swelling.  No past medical history on file. Current Outpatient Prescriptions  Medication Sig Dispense Refill  . ibuprofen (ADVIL) 600 MG tablet Take 1 tablet (600 mg total) by mouth every 6 (six) hours as needed for moderate pain.  30 tablet  0   No current facility-administered medications for this visit.   No family history on file. History   Social History  . Marital Status: Single    Spouse Name: N/A    Number of Children: N/A  . Years of Education: N/A   Social History Main Topics  . Smoking status: Never Smoker   . Smokeless tobacco: None  . Alcohol Use: No  . Drug Use: No  . Sexual Activity: None   Other Topics Concern  . None   Social History Narrative  . None    Review of Systems: Full 14-point review of systems otherwise negative. See HPI.   Objective:  Physical Exam: Filed Vitals:   03/14/13 1416   BP: 110/78  Pulse: 75  Temp: 98.3 F (36.8 C)  TempSrc: Oral  Height: 5\' 6"  (1.676 m)  Weight: 183 lb 14.4 oz (83.416 kg)  SpO2: 98%   Constitutional: Vital signs reviewed.  Patient is a well-developed and well-nourished, in no acute distress and cooperative with exam.   HEENT:  Head: Normocephalic and atraumatic Mouth: no erythema or exudates, MMM Eyes: PERRL, EOMI, conjunctivae normal, No scleral icterus.  Neck: Supple, Trachea midline normal ROM, No JVD  Cardiovascular: RRR, S1 normal, S2 normal, no MRG, pulses symmetric and intact bilaterally Pulmonary/Chest: CTAB, no wheezes, rales, or rhonchi Abdominal: Soft. Non-tender, non-distended, bowel sounds are normal, no masses, organomegaly, or guarding present.  GU: no CVA tenderness Musculoskeletal: No joint deformities, erythema, or stiffness, ROM full and non-tender Extremities: No leg edema Hematology: no cervical, inginal, or axillary adenopathy.  Neurological: A&O x3, Strength is normal and symmetric bilaterally, cranial nerve II-XII are grossly intact, no focal motor deficit, sensory intact to light touch bilaterally.  Skin: Warm, dry and intact. No rash, cyanosis, or clubbing.  Psychiatric: Normal mood and affect. No suicidal or homicidal ideation.  Assessment & Plan:

## 2013-03-17 NOTE — Progress Notes (Signed)
Case discussed with Dr. Niu soon after the resident saw the patient.  We reviewed the resident's history and exam and pertinent patient test results.  I agree with the assessment, diagnosis, and plan of care documented in the resident's note. 

## 2015-12-01 IMAGING — CR DG CHEST 2V
2 series · 2 of 2 positions shown · non-contrast
Comparison: None

CLINICAL DATA: Left lower chest pain for 2 days

EXAM:
CHEST  2 VIEW

[w chest pa]
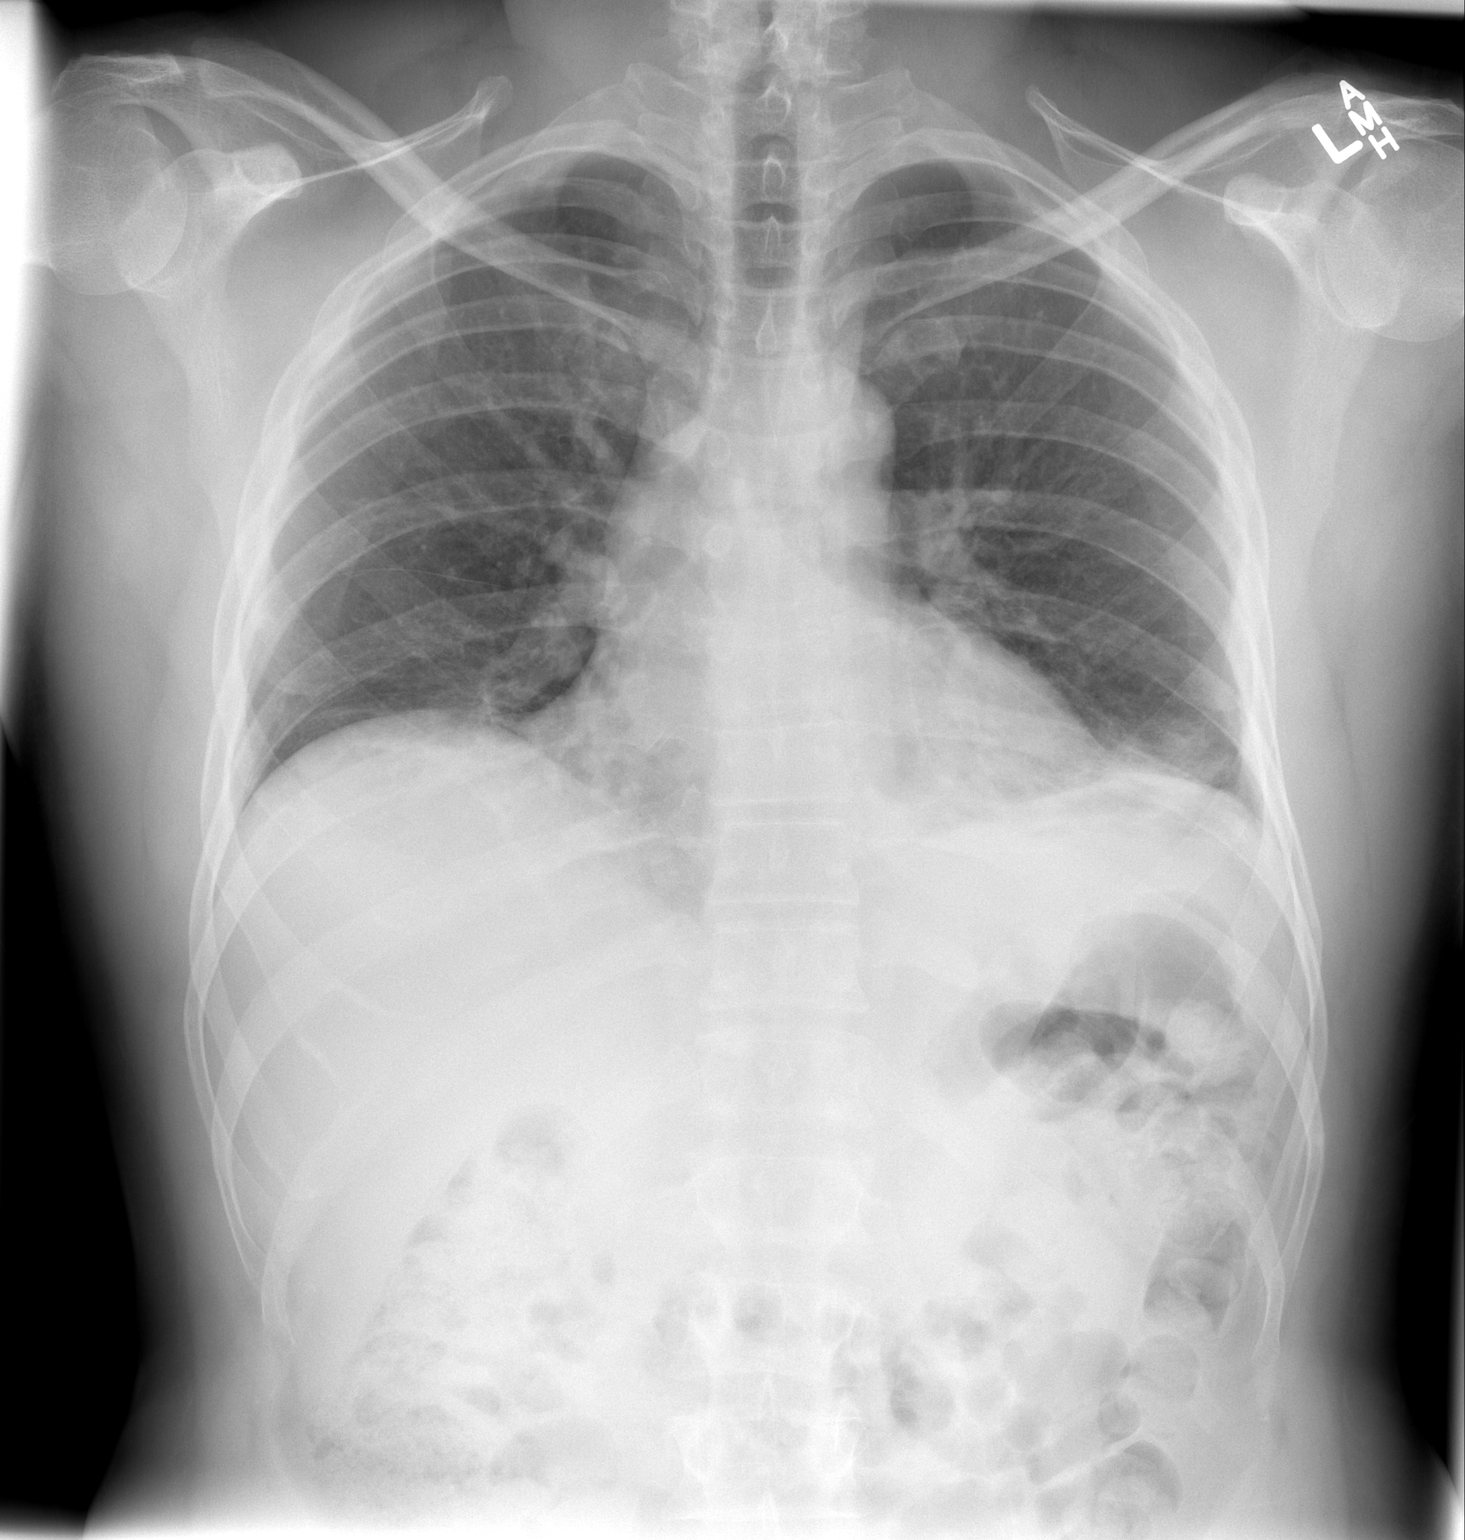

[w chest lat]
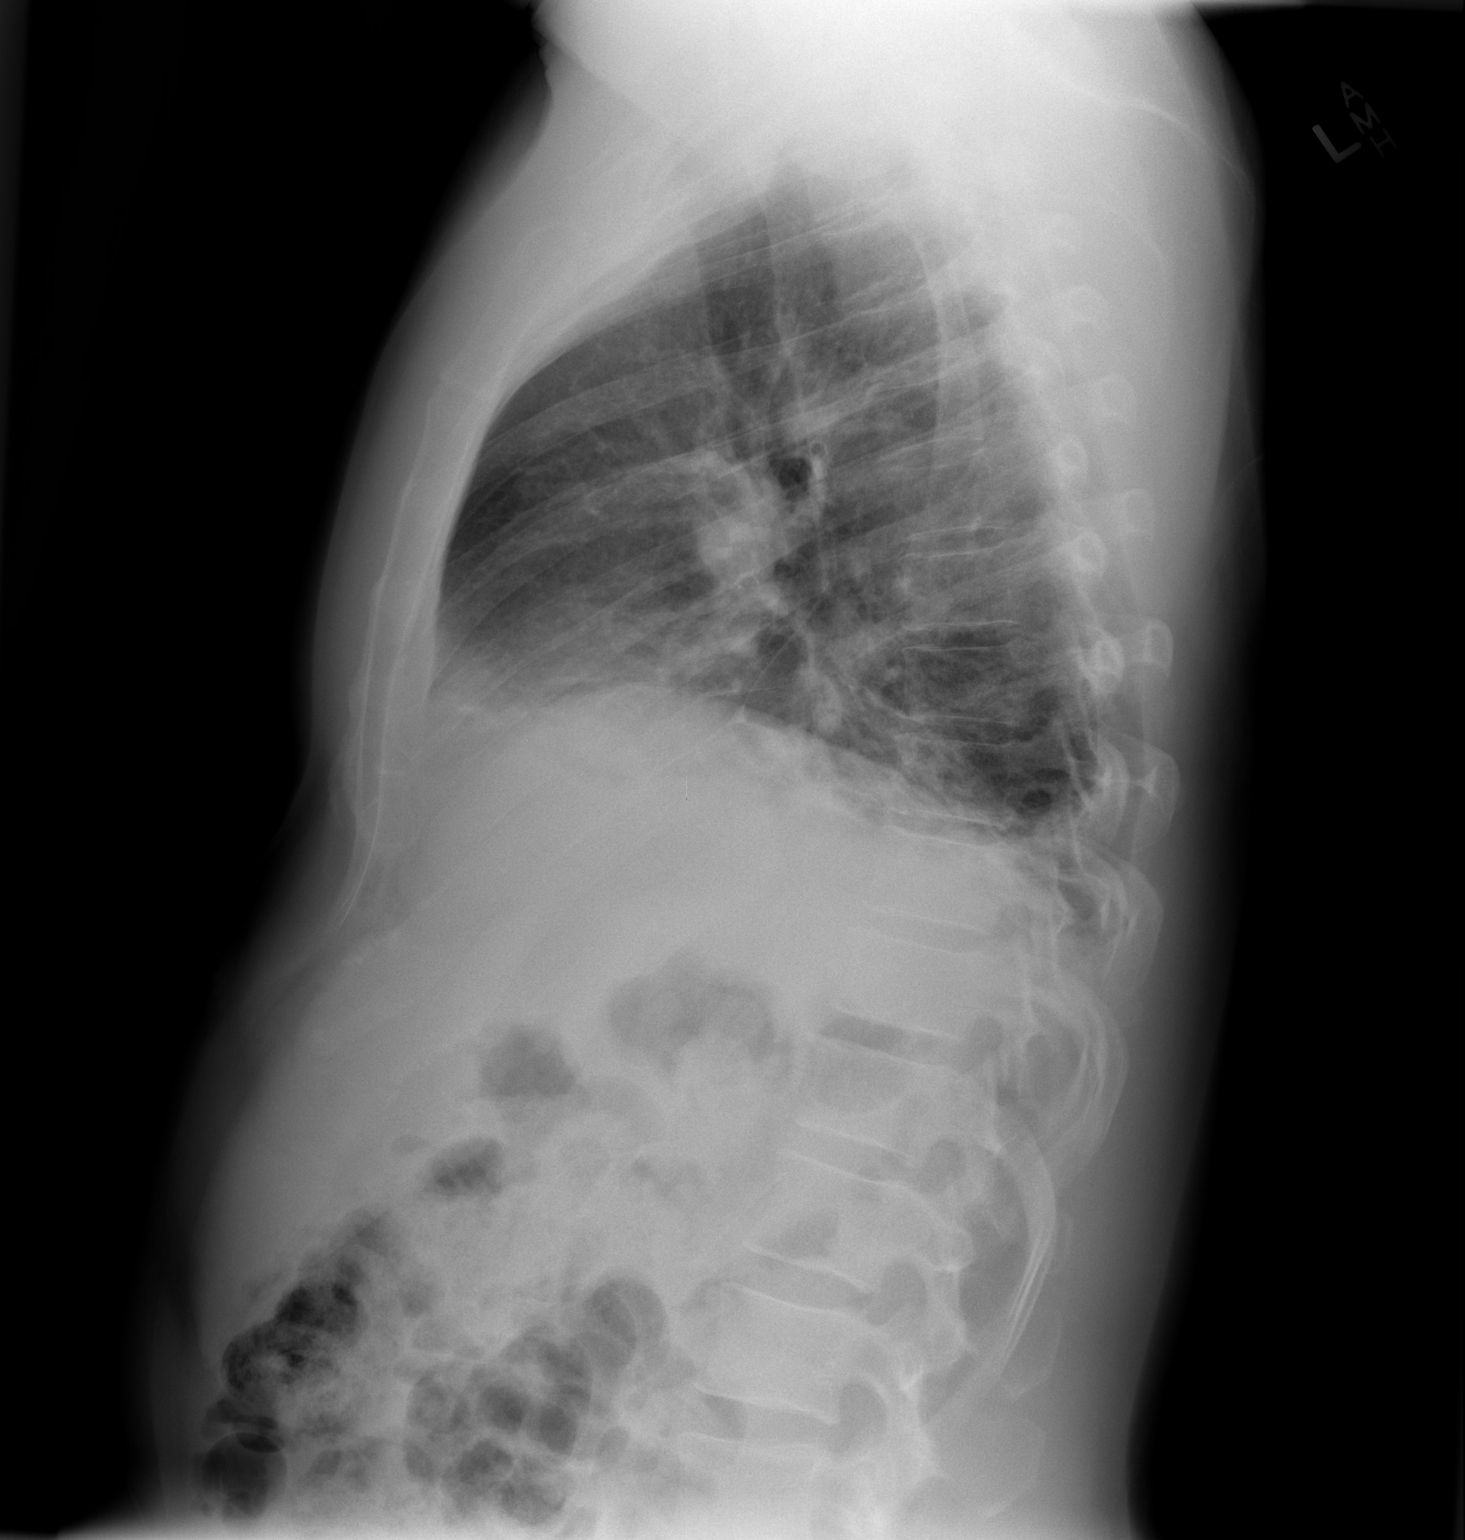

[2 of 2 positions shown; findings below may reference images not displayed]

FINDINGS: Heart size and vascular pattern normal. Limited inspiratory. Mild
lower lobe infiltrate on the right. More prominent infiltrate left
lower lobe. No pleural effusions.
IMPRESSION: Bilateral lower lobe infiltrates left more prominent than right. The
right-sided infiltrate could be due to atelectasis given the limited
inspiratory effect. The left side appears to be out of proportion
for atelectasis an would suggest pneumonia or pneumonitis.

## 2015-12-02 IMAGING — CR DG CHEST 2V
2 series · 2 of 2 positions shown · non-contrast
Comparison: 03/05/2013

CLINICAL DATA: Chest pain, shortness of breath, pneumonia

EXAM:
CHEST  2 VIEW

[w chest pa]
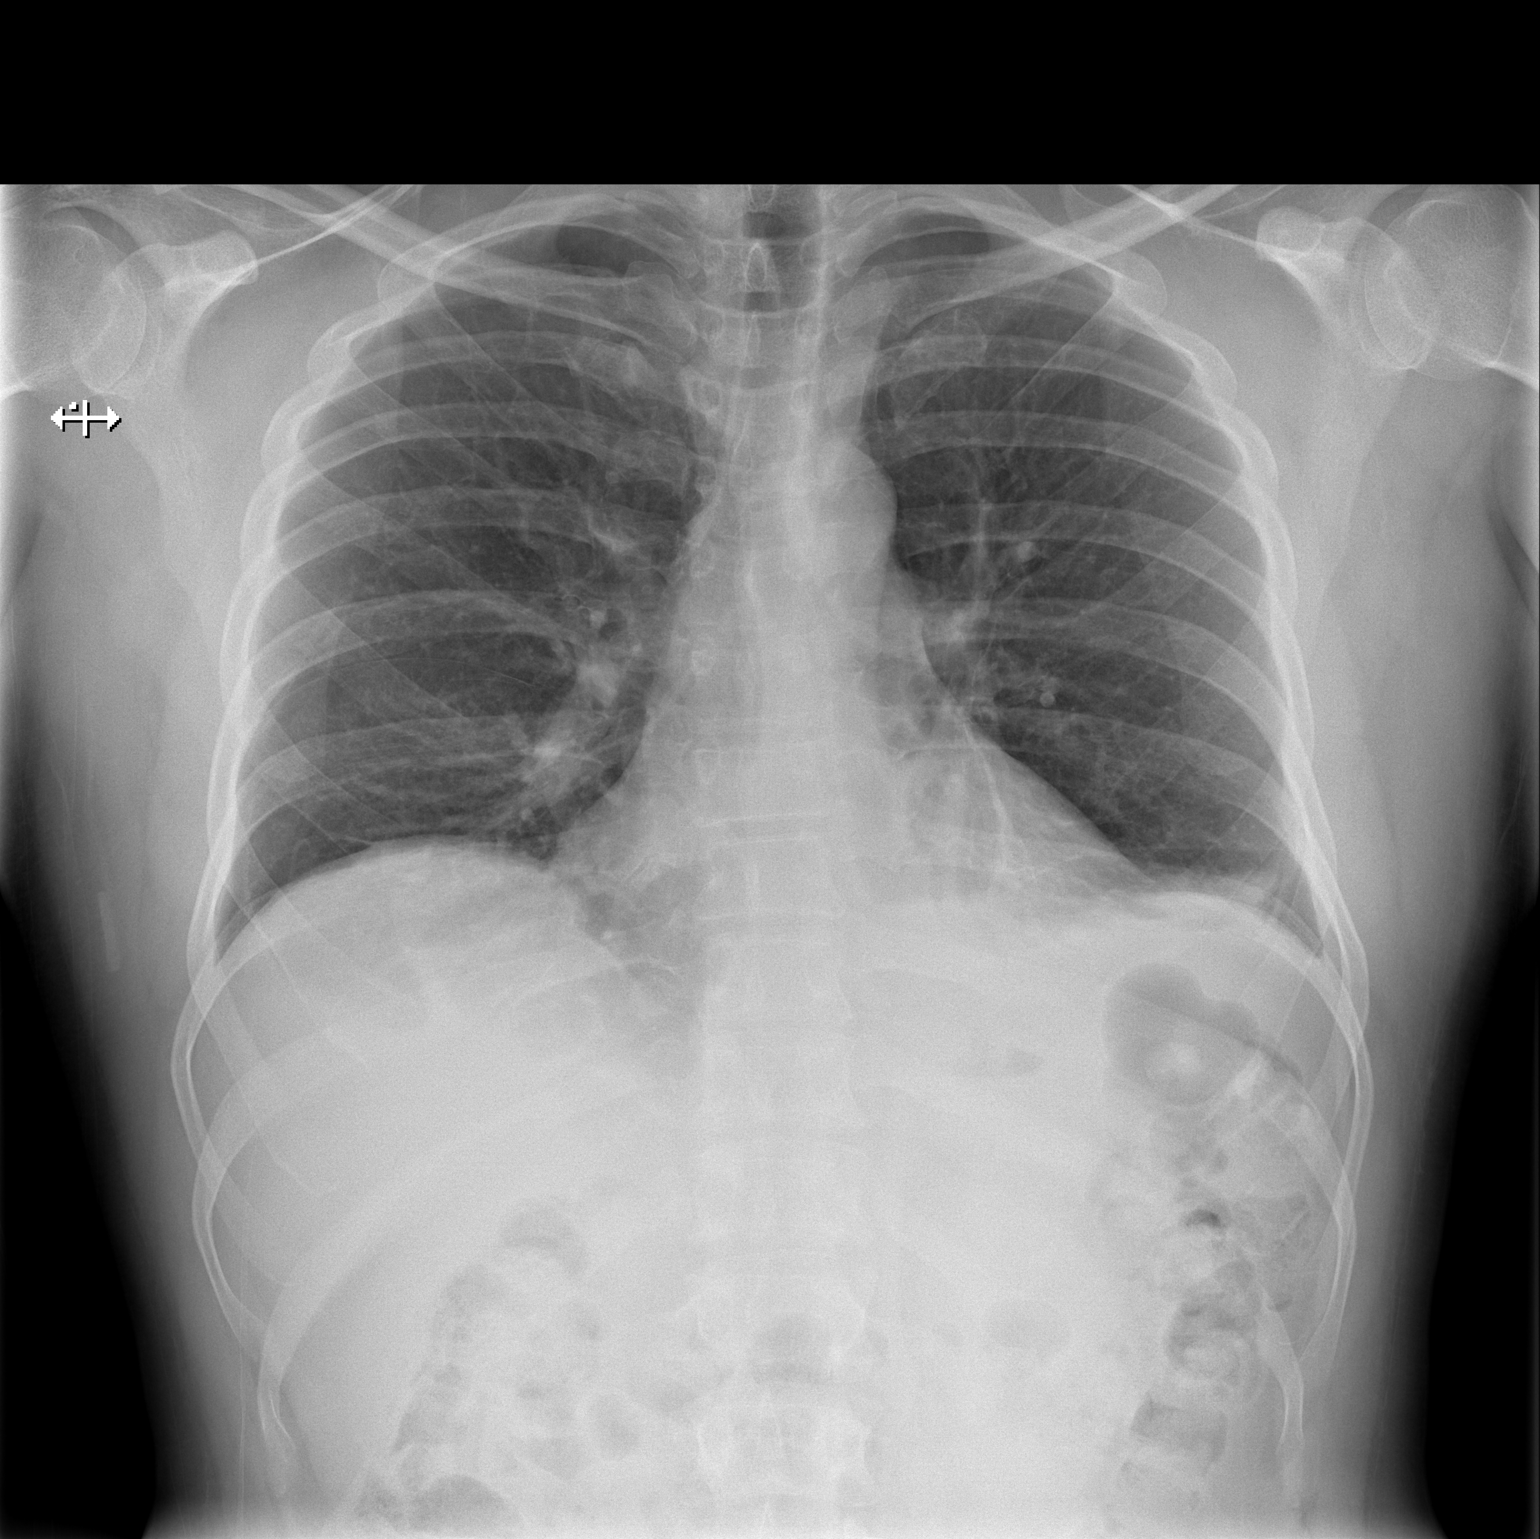

[w chest lat]
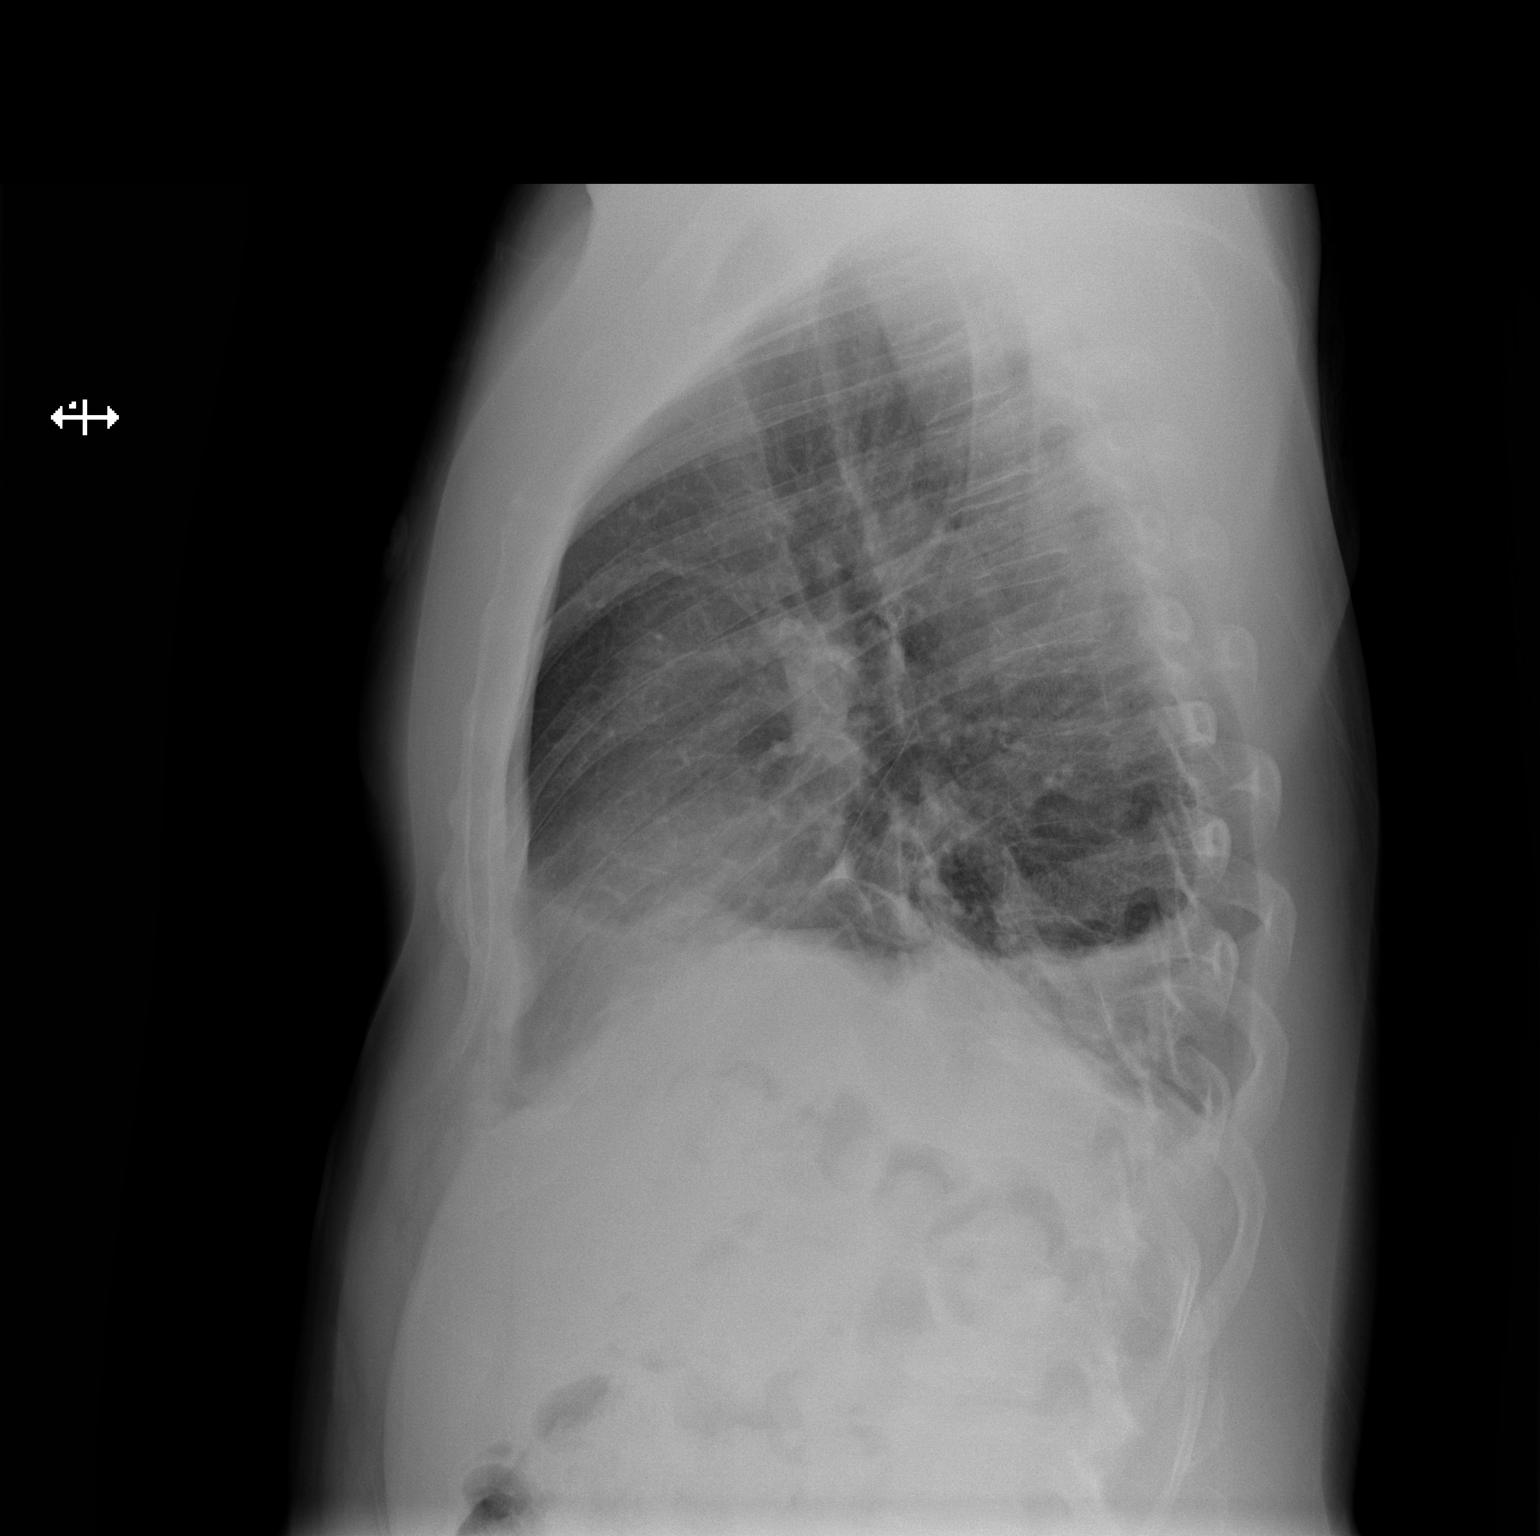

[2 of 2 positions shown; findings below may reference images not displayed]

FINDINGS: Mild enlargement of cardiac silhouette.

Mediastinal contours and pulmonary vascularity normal.

Mild right basilar atelectasis.

Atelectasis versus consolidation in left lower lobe.

Basilar findings are similar to the previous exam.

Upper lungs clear.

Small left pleural effusion.

No pneumothorax or acute osseous findings.
IMPRESSION: Right basilar atelectasis.

Atelectasis versus consolidation left lower lobe.

Small left pleural effusion.

## 2018-08-09 ENCOUNTER — Emergency Department (HOSPITAL_COMMUNITY)
Admission: EM | Admit: 2018-08-09 | Discharge: 2018-08-09 | Disposition: A | Discharge status: Home / Self Care | Payer: Self-pay | Attending: Emergency Medicine | Admitting: Emergency Medicine

## 2018-08-09 ENCOUNTER — Other Ambulatory Visit: Payer: Self-pay

## 2018-08-09 ENCOUNTER — Encounter (HOSPITAL_COMMUNITY): Payer: Self-pay | Admitting: *Deleted

## 2018-08-09 DIAGNOSIS — Z09 Encounter for follow-up examination after completed treatment for conditions other than malignant neoplasm: Secondary | ICD-10-CM | POA: Insufficient documentation

## 2018-08-09 DIAGNOSIS — Z5189 Encounter for other specified aftercare: Secondary | ICD-10-CM

## 2018-08-09 NOTE — Discharge Instructions (Addendum)
Please read attached information. If you experience any new or worsening signs or symptoms please return to the emergency room for evaluation. Please follow-up with your primary care provider or specialist as discussed.  °

## 2018-08-09 NOTE — ED Notes (Signed)
Patient verbalizes understanding of discharge instructions. Opportunity for questioning and answering were provided. Armband removed by staff , patient discharged from ED. 
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
## 2018-08-09 NOTE — ED Notes (Signed)
Patient in room, ready  For PA

## 2022-11-18 ENCOUNTER — Other Ambulatory Visit: Payer: Self-pay

## 2022-11-18 ENCOUNTER — Emergency Department (HOSPITAL_COMMUNITY): Payer: Medicaid Other

## 2022-11-18 ENCOUNTER — Encounter (HOSPITAL_COMMUNITY): Payer: Self-pay

## 2022-11-18 ENCOUNTER — Emergency Department (HOSPITAL_COMMUNITY)
Admission: EM | Admit: 2022-11-18 | Discharge: 2022-11-18 | Disposition: A | Discharge status: Home / Self Care | Payer: Medicaid Other

## 2022-11-18 DIAGNOSIS — H5711 Ocular pain, right eye: Secondary | ICD-10-CM | POA: Diagnosis not present

## 2022-11-18 DIAGNOSIS — S00501A Unspecified superficial injury of lip, initial encounter: Secondary | ICD-10-CM | POA: Diagnosis not present

## 2022-11-18 DIAGNOSIS — S0081XA Abrasion of other part of head, initial encounter: Secondary | ICD-10-CM | POA: Insufficient documentation

## 2022-11-18 DIAGNOSIS — R2232 Localized swelling, mass and lump, left upper limb: Secondary | ICD-10-CM | POA: Diagnosis not present

## 2022-11-18 DIAGNOSIS — S0993XA Unspecified injury of face, initial encounter: Secondary | ICD-10-CM | POA: Diagnosis present

## 2022-11-18 DIAGNOSIS — S0083XA Contusion of other part of head, initial encounter: Secondary | ICD-10-CM

## 2022-11-18 DIAGNOSIS — S62242A Displaced fracture of shaft of first metacarpal bone, left hand, initial encounter for closed fracture: Secondary | ICD-10-CM

## 2022-11-18 DIAGNOSIS — M79645 Pain in left finger(s): Secondary | ICD-10-CM | POA: Diagnosis not present

## 2022-11-18 LAB — CBC WITH DIFFERENTIAL/PLATELET
Abs Immature Granulocytes: 0.05 10*3/uL (ref 0.00–0.07)
Basophils Absolute: 0 10*3/uL (ref 0.0–0.1)
Basophils Relative: 0 %
Eosinophils Absolute: 0 10*3/uL (ref 0.0–0.5)
Eosinophils Relative: 0 %
HCT: 35.1 % — ABNORMAL LOW (ref 39.0–52.0)
Hemoglobin: 11.4 g/dL — ABNORMAL LOW (ref 13.0–17.0)
Immature Granulocytes: 0 %
Lymphocytes Relative: 23 %
Lymphs Abs: 2.6 10*3/uL (ref 0.7–4.0)
MCH: 28.6 pg (ref 26.0–34.0)
MCHC: 32.5 g/dL (ref 30.0–36.0)
MCV: 88.2 fL (ref 80.0–100.0)
Monocytes Absolute: 0.6 10*3/uL (ref 0.1–1.0)
Monocytes Relative: 5 %
Neutro Abs: 8.1 10*3/uL — ABNORMAL HIGH (ref 1.7–7.7)
Neutrophils Relative %: 72 %
Platelets: 239 10*3/uL (ref 150–400)
RBC: 3.98 MIL/uL — ABNORMAL LOW (ref 4.22–5.81)
RDW: 12.7 % (ref 11.5–15.5)
WBC: 11.5 10*3/uL — ABNORMAL HIGH (ref 4.0–10.5)
nRBC: 0 % (ref 0.0–0.2)

## 2022-11-18 LAB — BASIC METABOLIC PANEL
Anion gap: 12 (ref 5–15)
BUN: 17 mg/dL (ref 6–20)
CO2: 25 mmol/L (ref 22–32)
Calcium: 9.5 mg/dL (ref 8.9–10.3)
Chloride: 102 mmol/L (ref 98–111)
Creatinine, Ser: 1.34 mg/dL — ABNORMAL HIGH (ref 0.61–1.24)
GFR, Estimated: >60 mL/min (ref >60)
Glucose, Bld: 84 mg/dL (ref 70–99)
Potassium: 4.5 mmol/L (ref 3.5–5.1)
Sodium: 139 mmol/L (ref 135–145)

## 2022-11-18 MED ORDER — OXYCODONE-ACETAMINOPHEN 5-325 MG PO TABS
1.0000 | ORAL_TABLET | Freq: Three times a day (TID) | ORAL | 0 refills | Status: DC | PRN
Start: 2022-11-18 — End: 2022-11-18

## 2022-11-18 MED ORDER — HYDROCODONE-ACETAMINOPHEN 5-325 MG PO TABS
1.0000 | ORAL_TABLET | Freq: Once | ORAL | Status: AC
  Administered 2022-11-18: 1 via ORAL
  Filled 2022-11-18: qty 1

## 2022-11-18 MED ORDER — OXYCODONE-ACETAMINOPHEN 5-325 MG PO TABS
1.0000 | ORAL_TABLET | Freq: Three times a day (TID) | ORAL | 0 refills | Status: DC | PRN
Start: 2022-11-18 — End: 2022-11-28

## 2022-11-18 NOTE — Progress Notes (Signed)
Orthopedic Tech Progress Note Patient Details:  Seth Jordan 16-Dec-1970 [id number]  Ortho Devices Type of Ortho Device: Thumb spica splint Splint Material: Plaster Ortho Device/Splint Location: LUE Ortho Device/Splint Interventions: Ordered, Application   Post Interventions Patient Tolerated: Well Instructions Provided: Care of device  Seth Jordan A Gleen Ripberger 11/18/2022, 12:31 PM
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
## 2022-11-18 NOTE — ED Notes (Addendum)
Per provider request CT called to check on status of patient going for CT. Tech stated they are behind and he is next in line. Provider informed. Ortho tech informed of need for splinting.
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
## 2022-11-18 NOTE — ED Notes (Signed)
Left hand elevated. Ice applied. No obvious distress. No active bleeding. Neuro check WDL.
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
## 2022-11-28 ENCOUNTER — Emergency Department (HOSPITAL_COMMUNITY)
Admission: EM | Admit: 2022-11-28 | Discharge: 2022-11-28 | Disposition: A | Discharge status: Home / Self Care | Payer: Self-pay | Attending: Emergency Medicine | Admitting: Emergency Medicine

## 2022-11-28 ENCOUNTER — Encounter (HOSPITAL_COMMUNITY): Payer: Self-pay

## 2022-11-28 ENCOUNTER — Other Ambulatory Visit: Payer: Self-pay

## 2022-11-28 ENCOUNTER — Emergency Department (HOSPITAL_COMMUNITY): Payer: Self-pay

## 2022-11-28 ENCOUNTER — Ambulatory Visit (HOSPITAL_COMMUNITY)
Admission: EM | Admit: 2022-11-28 | Discharge: 2022-11-28 | Disposition: A | Discharge status: Home / Self Care | Payer: No Payment, Other | Attending: Psychiatry | Admitting: Psychiatry

## 2022-11-28 DIAGNOSIS — Z765 Malingerer [conscious simulation]: Secondary | ICD-10-CM | POA: Insufficient documentation

## 2022-11-28 DIAGNOSIS — F1090 Alcohol use, unspecified, uncomplicated: Secondary | ICD-10-CM | POA: Insufficient documentation

## 2022-11-28 DIAGNOSIS — F10129 Alcohol abuse with intoxication, unspecified: Secondary | ICD-10-CM | POA: Insufficient documentation

## 2022-11-28 DIAGNOSIS — F10920 Alcohol use, unspecified with intoxication, uncomplicated: Secondary | ICD-10-CM

## 2022-11-28 DIAGNOSIS — Z5900 Homelessness unspecified: Secondary | ICD-10-CM | POA: Insufficient documentation

## 2022-11-28 DIAGNOSIS — F10120 Alcohol abuse with intoxication, uncomplicated: Secondary | ICD-10-CM | POA: Insufficient documentation

## 2022-11-28 DIAGNOSIS — S62232A Other displaced fracture of base of first metacarpal bone, left hand, initial encounter for closed fracture: Secondary | ICD-10-CM | POA: Insufficient documentation

## 2022-11-28 DIAGNOSIS — S62242G Displaced fracture of shaft of first metacarpal bone, left hand, subsequent encounter for fracture with delayed healing: Secondary | ICD-10-CM | POA: Insufficient documentation

## 2022-11-28 HISTORY — DX: Bipolar disorder, unspecified: F31.9

## 2022-11-28 HISTORY — DX: Schizoaffective disorder, unspecified: F25.9

## 2022-11-28 MED ORDER — ACETAMINOPHEN 325 MG PO TABS: 650.0000 mg | ORAL_TABLET | Freq: Once | ORAL | Status: DC
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
## 2022-11-28 NOTE — ED Notes (Signed)
Pt adamantly refusing splint, ice pack, tylenol at this time. Pt states "nah it don't work, I done did all that two weeks ago." Dr. Wilkie Aye updated.

## 2022-11-28 NOTE — Discharge Instructions (Signed)
F/u with outpatient resources F/u with [facility]
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
## 2022-11-28 NOTE — ED Triage Notes (Signed)
Patient reports left hand pain for several weeks with mild swelling .

## 2022-11-28 NOTE — ED Notes (Signed)
Patient has been given his discharge summary and escorted to the waiting room. Patient requested that [employer] transport him to the hospital.

## 2022-11-28 NOTE — ED Provider Notes (Signed)
[place] [facility]Y DE[facility]MENT AT [facility] Provider Note   CSN: [id number] Arrival date & time: 11/28/22  0100     History  Chief Complaint  Patient presents with   Hand Pain    Seth Jordan is a 52 y.o. male.  HPI     This is a 52 year old male who presents by EMS with complaints of left thumb pain and hand pain.  Patient had a prior injury and was seen and evaluated on 10/15 for the same.  He had a first metacarpal fracture.  Patient was picked up at [facility] and requested to be transported.  He is clearly intoxicated.  He states he drinks every day.  Denies new injury.  Has not maintained splint that was applied during last visit.  Home Medications Prior to Admission medications   Medication Sig Start Date End Date Taking? Authorizing Provider  oxyCODONE-acetaminophen (PERCOCET/ROXICET) 5-325 MG tablet Take 1 tablet by mouth every 8 (eight) hours as needed for severe pain (pain score 7-10). 11/18/22   Barrett, Horald Chestnut, PA-C      Allergies    Patient has no known allergies.    Review of Systems   Review of Systems  Constitutional:  Negative for fever.  Respiratory:  Negative for shortness of breath.   Cardiovascular:  Negative for chest pain.  Musculoskeletal:        Thumb swelling  All other systems reviewed and are negative.   Physical Exam Updated Vital Signs BP 120/82 (BP Location: Left Arm)   Pulse 99   Temp 98.3 F (36.8 C) (Oral)   Resp 17   Ht 1.676 m (5\' 6" )   Wt 68 kg   SpO2 100%   BMI 24.21 kg/m  Physical Exam Vitals and nursing note reviewed.  Constitutional:      Appearance: He is well-developed. He is not ill-appearing.  HENT:     Head: Normocephalic and atraumatic.  Eyes:     Pupils: Pupils are equal, round, and reactive to light.  Cardiovascular:     Rate and Rhythm: Normal rate and regular rhythm.  Pulmonary:     Effort: Pulmonary effort is normal. No respiratory distress.  Abdominal:     Palpations: Abdomen is  soft.     Tenderness: There is no abdominal tenderness.  Musculoskeletal:     Cervical back: Neck supple.     Comments: Swelling noted to the base of the left first digit  Lymphadenopathy:     Cervical: No cervical adenopathy.  Skin:    General: Skin is warm and dry.  Neurological:     Mental Status: He is alert and oriented to person, place, and time.  Psychiatric:     Comments: Intoxicated, labile mood     ED Results / Procedures / Treatments   Labs (all labs ordered are listed, but only abnormal results are displayed) Labs Reviewed - No data to display  EKG None  Radiology DG Finger Thumb Left  Result Date: 11/28/2022 CLINICAL DATA:  First digit swelling, initial encounter EXAM: LEFT THUMB 2+V COMPARISON:  11/18/2022 FINDINGS: Fracture at the base of the first metacarpal is again seen with increased angulation and displacement identified. The phalanges of the first finger are within normal limits. Persistent soft tissue calcification is seen adjacent to the first proximal phalanx. IMPRESSION: Persistent first metacarpal fracture with increased angulation and displacement. Electronically Signed   By: Alcide Clever M.D.   On: 11/28/2022 01:26    Procedures Procedures    Medications  Ordered in ED Medications  acetaminophen (TYLENOL) tablet 650 mg (has no administration in time range)    ED Course/ Medical Decision Making/ A&P                                 Medical Decision Making Amount and/or Complexity of Data Reviewed Radiology: ordered.  Risk OTC drugs.   This patient presents to the ED for concern of a alcohol intoxication, hand pain, this involves an extensive number of treatment options, and is a complaint that carries with it a high risk of complications and morbidity.  I considered the following differential and admission for this acute, potentially life threatening condition.  The differential diagnosis includes new injury, noted old injury from several  weeks ago, acute intoxication  MDM:    This is a 52 year old male who presents with left hand pain.  He is acutely intoxicated.  He continues to complain of left hand pain.  Was noted to have a metacarpal fracture several weeks ago but is not wearing his thumb spica and has not followed up.  Repeat x-rays show persistent displaced first metacarpal fracture.  Patient refused splinting.  Refused any pain medication.  Reports that he would like to leave.  He is awake, alert, oriented.  He does appear intoxicated but can ambulate independently.  Discussed with him that he needs to follow-up with hand surgery.  This will likely not heal well without splinting.  Patient states understanding.  (Labs, imaging, consults)  Labs: I Ordered, and personally interpreted labs.  The pertinent results include: None  Imaging Studies ordered: I ordered imaging studies including x-ray left thumb I independently visualized and interpreted imaging. I agree with the radiologist interpretation  Additional history obtained from chart review.  External records from outside source obtained and reviewed including prior x-rays  Cardiac Monitoring: The patient was not maintained on a cardiac monitor.  If on the cardiac monitor, I personally viewed and interpreted the cardiac monitored which showed an underlying rhythm of: N/A  Reevaluation: After the interventions noted above, I reevaluated the patient and found that they have :stayed the same  Social Determinants of Health:  alcohol abuse  Disposition: Discharge  Co morbidities that complicate the patient evaluation  Past Medical History:  Diagnosis Date   Bipolar disease, chronic (HCC)    Schizo affective schizophrenia (HCC)      Medicines Meds ordered this encounter  Medications   acetaminophen (TYLENOL) tablet 650 mg    I have reviewed the patients home medicines and have made adjustments as needed  Problem List / ED Course: Problem List Items  Addressed This Visit   None Visit Diagnoses     Other closed displaced fracture of base of first metacarpal bone of left hand, initial encounter    -  Primary   Alcoholic intoxication without complication (HCC)                       Final Clinical Impression(s) / ED Diagnoses Final diagnoses:  Other closed displaced fracture of base of first metacarpal bone of left hand, initial encounter  Alcoholic intoxication without complication [facility])    Rx / DC Orders ED Discharge Orders     None         Charina Fons, Mayer Masker, MD 11/28/22 [phone]
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
## 2022-11-28 NOTE — Discharge Instructions (Addendum)
You were seen today for hand pain.  You have a persistent left first metacarpal fracture.  You should wear his thumb spica.  You refused this.  You will continue to have ongoing pain and this will likely not heal well without proper splinting and follow-up.  Follow-up with hand surgery.

## 2022-11-28 NOTE — Discharge Instructions (Signed)
You were seen in the ER for ongoing hand pain related to a previous fracture.  I reviewed the x-ray from your ER visit a few hours ago, do not feel we need to repeat this.  I have given you an additional splint.  Have also attached resources for substance abuse counseling both inpatient and outpatient that you can follow-up with.  If you are able to follow-up with a hand surgeon this would be great.  If this is not within your means, please wear the splint as as much as possible.  Continue to monitor how you're doing and return to the ER for new or worsening symptoms.

## 2022-12-07 ENCOUNTER — Encounter (HOSPITAL_COMMUNITY): Payer: Self-pay

## 2022-12-07 ENCOUNTER — Other Ambulatory Visit: Payer: Self-pay

## 2022-12-07 ENCOUNTER — Emergency Department (HOSPITAL_COMMUNITY)
Admission: EM | Admit: 2022-12-07 | Discharge: 2022-12-07 | Disposition: A | Discharge status: Home / Self Care | Payer: Self-pay | Attending: Emergency Medicine | Admitting: Emergency Medicine

## 2022-12-07 DIAGNOSIS — F10920 Alcohol use, unspecified with intoxication, uncomplicated: Secondary | ICD-10-CM

## 2022-12-07 DIAGNOSIS — F10120 Alcohol abuse with intoxication, uncomplicated: Secondary | ICD-10-CM | POA: Insufficient documentation

## 2022-12-07 LAB — CBG MONITORING, ED: Glucose-Capillary: 79 mg/dL (ref 70–99)

## 2022-12-07 NOTE — ED Triage Notes (Signed)
[facility] [address] c/o acute alcohol intoxication. Arrives A+O, VSS, NADN.

## 2022-12-07 NOTE — ED Notes (Signed)
Pt. Ambulated to the bathroom, and has eaten a breakfast sandwich and orange juice.

## 2022-12-07 NOTE — ED Provider Notes (Signed)
  [place] EMERGENCY DEPARTMENT AT [facility] Provider Note   CSN: [id number] Arrival date & time: 12/07/22  0146     History  Chief Complaint  Patient presents with   Alcohol Intoxication    Seth Jordan is a 52 y.o. male.  The history is provided by the patient.  Alcohol Intoxication  Seth Jordan is a 52 y.o. male who presents to the Emergency Department complaining of alcohol intoxication.  He presents to the emergency department by EMS from the [facility] with a complaint of alcohol intoxication.  Patient reports that drinking alcohol.  No reported injuries.  Level 5 caveat due to intoxication.     Home Medications Prior to Admission medications   Not on File      Allergies    Patient has no known allergies.    Review of Systems   Review of Systems  All other systems reviewed and are negative.   Physical Exam Updated Vital Signs BP 118/83 (BP Location: Left Arm)   Pulse (!) 101   Temp 98 F (36.7 C)   Resp 16   SpO2 100%  Physical Exam Vitals and nursing note reviewed.  Constitutional:      Appearance: He is well-developed.     Comments: Wakens her easily but falls back asleep quickly.  HENT:     Head: Normocephalic and atraumatic.  Cardiovascular:     Rate and Rhythm: Normal rate and regular rhythm.     Heart sounds: No murmur heard. Pulmonary:     Effort: Pulmonary effort is normal. No respiratory distress.     Breath sounds: Normal breath sounds.  Abdominal:     Palpations: Abdomen is soft.     Tenderness: There is no abdominal tenderness. There is no guarding or rebound.  Musculoskeletal:        General: No tenderness.  Skin:    General: Skin is warm and dry.  Neurological:     Comments: Drowsy, awoken from sleep.  Dysarthric speech.  Unsteady gait.  5 out of 5 strength in all 4 extremities.  Psychiatric:        Behavior: Behavior normal.     ED Results / Procedures / Treatments   Labs (all labs ordered are listed, but  only abnormal results are displayed) Labs Reviewed  CBG MONITORING, ED    EKG None  Radiology No results found.  Procedures Procedures    Medications Ordered in ED Medications - No data to display  ED Course/ Medical Decision Making/ A&P                                 Medical Decision Making  Patient with history of alcohol abuse, malingering here for evaluation of alcohol intoxication.  He does report drinking alcohol.  He does appear intoxicated on examination, quickly falls back asleep.  No reported trauma although he is a poor historian.  Patient care transferred pending metabolization and reevaluation.        Final Clinical Impression(s) / ED Diagnoses Final diagnoses:  None    Rx / DC Orders ED Discharge Orders     None         Tilden Fossa, MD 12/07/22 [phone]

## 2022-12-07 NOTE — ED Provider Notes (Signed)
7:36 AM Care assumed from Dr. Madilyn Hook.  At time of transfer of care, patient is intoxicated and is awaiting metabolism and reassessment.  No reported concern for trauma patient just appeared intoxicated.  Plan of care is to reassess in several hours and see if he is improving and feeling better.  If he is feeling better, plan of care is to discharge home.  11:03 AM Patient was able to ambulate to the bathroom safely per nursing and was able to answer some questions.  Patient eating and drinking and reports he is feeling better.  Will discharge home for outpatient follow-up.  Clinical Impression: 1. Alcoholic intoxication without complication (HCC)     Disposition: Discharge  Condition: Good  I have discussed the results, Dx and Tx plan with the pt(& family if present). He/she/they expressed understanding and agree(s) with the plan. Discharge instructions discussed at great length. Strict return precautions discussed and pt &/or family have verbalized understanding of the instructions. No further questions at time of discharge.    New Prescriptions   No medications on file    Follow Up: [facility] [address] [place] [address] [place] [address] [phone] Schedule an appointment as soon as possible for a visit    [facility] Emergency Department at [facility] [address] [place] [address] [phone]           Vasti Yagi, Canary Brim, MD 12/07/22 1536

## 2022-12-07 NOTE — Discharge Instructions (Signed)
Please follow-up with a primary doctor and rest and stay hydrated.  If symptoms change or worsen, return to the nearest emergency department.

## 2022-12-30 ENCOUNTER — Other Ambulatory Visit: Payer: Self-pay

## 2022-12-30 ENCOUNTER — Emergency Department (HOSPITAL_COMMUNITY)
Admission: EM | Admit: 2022-12-30 | Discharge: 2022-12-30 | Disposition: A | Discharge status: Home / Self Care | Payer: Self-pay | Attending: Emergency Medicine | Admitting: Emergency Medicine

## 2022-12-30 ENCOUNTER — Emergency Department (HOSPITAL_COMMUNITY): Payer: Self-pay

## 2022-12-30 ENCOUNTER — Encounter (HOSPITAL_COMMUNITY): Payer: Self-pay

## 2022-12-30 DIAGNOSIS — R079 Chest pain, unspecified: Secondary | ICD-10-CM | POA: Diagnosis present

## 2022-12-30 DIAGNOSIS — Y906 Blood alcohol level of 120-199 mg/100 ml: Secondary | ICD-10-CM | POA: Diagnosis not present

## 2022-12-30 LAB — CBC WITH DIFFERENTIAL/PLATELET
Abs Immature Granulocytes: 0.01 10*3/uL (ref 0.00–0.07)
Basophils Absolute: 0 10*3/uL (ref 0.0–0.1)
Basophils Relative: 1 %
Eosinophils Absolute: 0.1 10*3/uL (ref 0.0–0.5)
Eosinophils Relative: 1 %
HCT: 33.1 % — ABNORMAL LOW (ref 39.0–52.0)
Hemoglobin: 10.7 g/dL — ABNORMAL LOW (ref 13.0–17.0)
Immature Granulocytes: 0 %
Lymphocytes Relative: 25 %
Lymphs Abs: 1.7 10*3/uL (ref 0.7–4.0)
MCH: 28.9 pg (ref 26.0–34.0)
MCHC: 32.3 g/dL (ref 30.0–36.0)
MCV: 89.5 fL (ref 80.0–100.0)
Monocytes Absolute: 0.9 10*3/uL (ref 0.1–1.0)
Monocytes Relative: 13 %
Neutro Abs: 4.3 10*3/uL (ref 1.7–7.7)
Neutrophils Relative %: 60 %
Platelets: 265 10*3/uL (ref 150–400)
RBC: 3.7 MIL/uL — ABNORMAL LOW (ref 4.22–5.81)
RDW: 13.3 % (ref 11.5–15.5)
WBC: 7 10*3/uL (ref 4.0–10.5)
nRBC: 0 % (ref 0.0–0.2)

## 2022-12-30 LAB — COMPREHENSIVE METABOLIC PANEL
ALT: 18 U/L (ref 0–44)
AST: 39 U/L (ref 15–41)
Albumin: 3.7 g/dL (ref 3.5–5.0)
Alkaline Phosphatase: 59 U/L (ref 38–126)
Anion gap: 13 (ref 5–15)
BUN: 25 mg/dL — ABNORMAL HIGH (ref 6–20)
CO2: 18 mmol/L — ABNORMAL LOW (ref 22–32)
Calcium: 9.3 mg/dL (ref 8.9–10.3)
Chloride: 100 mmol/L (ref 98–111)
Creatinine, Ser: 1.61 mg/dL — ABNORMAL HIGH (ref 0.61–1.24)
GFR, Estimated: 51 mL/min — ABNORMAL LOW (ref >60)
Glucose, Bld: 72 mg/dL (ref 70–99)
Potassium: 4.4 mmol/L (ref 3.5–5.1)
Sodium: 131 mmol/L — ABNORMAL LOW (ref 135–145)
Total Bilirubin: 0.5 mg/dL (ref <1.2)
Total Protein: 7.1 g/dL (ref 6.5–8.1)

## 2022-12-30 LAB — ETHANOL: Alcohol, Ethyl (B): 150 mg/dL — ABNORMAL HIGH (ref <10)

## 2022-12-30 LAB — TROPONIN I (HIGH SENSITIVITY)
Troponin I (High Sensitivity): 10 ng/L (ref <18)
Troponin I (High Sensitivity): 8 ng/L (ref <18)

## 2022-12-30 LAB — LIPASE, BLOOD: Lipase: 39 U/L (ref 11–51)

## 2022-12-30 MED ORDER — SODIUM CHLORIDE 0.9 % IV BOLUS
500.0000 mL | Freq: Once | INTRAVENOUS | Status: AC
  Administered 2022-12-30: 500 mL via INTRAVENOUS

## 2022-12-30 NOTE — ED Triage Notes (Signed)
Pt present to ED with c/o chest pain, onset two hours ago. Pt received, nitroglycerin times one dose, aspirin 324mg . Pt A&Ox3 at this time.

## 2022-12-30 NOTE — ED Provider Notes (Signed)
S[facility] Provider Note   CSN: [id number] Arrival date & time: 12/30/22  1850     History  Chief Complaint  Patient presents with   Chest Pain    Seth Jordan is a 52 y.o. male.  Patient here with chest pain.  Started few hours ago.  History of schizophrenia and bipolar.  Admits alcohol use but denies any cocaine use.  He has been doing a lot of walking today.  Nothing makes it worse or better.  He denies any active pain now.  He got aspirin and nitroglycerin with EMS.  Patient denies weakness numbness tingling.  No shortness of breath.  No recent surgery or travel.  Denies any shortness of breath.  Denies any cough or sputum production.  The history is provided by the patient.       Home Medications Prior to Admission medications   Not on File      Allergies    Patient has no known allergies.    Review of Systems   Review of Systems  Physical Exam Updated Vital Signs BP (!) 130/95   Pulse 95   Temp 98.3 F (36.8 C) (Oral)   Resp 17   Ht 5\' 6"  (1.676 m)   Wt 68 kg   SpO2 100%   BMI 24.20 kg/m  Physical Exam Vitals and nursing note reviewed.  Constitutional:      General: He is not in acute distress.    Appearance: He is well-developed. He is not ill-appearing.  HENT:     Head: Normocephalic and atraumatic.  Eyes:     Extraocular Movements: Extraocular movements intact.     Conjunctiva/sclera: Conjunctivae normal.     Pupils: Pupils are equal, round, and reactive to light.  Cardiovascular:     Rate and Rhythm: Normal rate and regular rhythm.     Pulses:          Radial pulses are 2+ on the right side and 2+ on the left side.     Heart sounds: Normal heart sounds. No murmur heard. Pulmonary:     Effort: Pulmonary effort is normal. No respiratory distress.     Breath sounds: Normal breath sounds.  Abdominal:     Palpations: Abdomen is soft.     Tenderness: There is no abdominal tenderness.   Musculoskeletal:        General: No swelling. Normal range of motion.     Cervical back: Normal range of motion and neck supple.     Right lower leg: No edema.     Left lower leg: No edema.  Skin:    General: Skin is warm and dry.     Capillary Refill: Capillary refill takes less than 2 seconds.  Neurological:     Mental Status: He is alert.  Psychiatric:        Mood and Affect: Mood normal.     ED Results / Procedures / Treatments   Labs (all labs ordered are listed, but only abnormal results are displayed) Labs Reviewed  CBC WITH DIFFERENTIAL/PLATELET - Abnormal; Notable for the following components:      Result Value   RBC 3.70 (*)    Hemoglobin 10.7 (*)    HCT 33.1 (*)    All other components within normal limits  COMPREHENSIVE METABOLIC PANEL - Abnormal; Notable for the following components:   Sodium 131 (*)    CO2 18 (*)    BUN 25 (*)    Creatinine,  Ser 1.61 (*)    GFR, Estimated 51 (*)    All other components within normal limits  ETHANOL - Abnormal; Notable for the following components:   Alcohol, Ethyl (B) 150 (*)    All other components within normal limits  LIPASE, BLOOD  TROPONIN I (HIGH SENSITIVITY)  TROPONIN I (HIGH SENSITIVITY)    EKG EKG Interpretation Date/Time:  Tuesday December 30 2022 19:04:01 EST Ventricular Rate:  94 PR Interval:  155 QRS Duration:  121 QT Interval:  366 QTC Calculation: 458 R Axis:   -73  Text Interpretation: Sinus rhythm RBBB and LAFB same as prior EKGs Confirmed by Virgina Norfolk [phone]) on 12/30/2022 7:17:11 PM  Radiology DG Chest Portable 1 View  Result Date: 12/30/2022 CLINICAL DATA:  Chest pain EXAM: PORTABLE CHEST 1 VIEW COMPARISON:  03/06/2013 FINDINGS: The heart size and mediastinal contours are within normal limits. Both lungs are clear. The visualized skeletal structures are unremarkable. IMPRESSION: No active disease. Electronically Signed   By: Helyn Numbers M.D.   On: 12/30/2022 20:24     Procedures Procedures    Medications Ordered in ED Medications  sodium chloride 0.9 % bolus 500 mL ( Intravenous Rate/Dose Verify 12/30/22 2220)    ED Course/ Medical Decision Making/ A&P                                 Medical Decision Making Amount and/or Complexity of Data Reviewed Labs: ordered. Radiology: ordered.   Seth Jordan is here with chest pain.  History of schizophrenia and bipolar.  Admits alcohol use today.  Denies any trauma.  Denies any cocaine use or drug use otherwise.  He has had some chest pain today.  He has been doing a lot of walking today.  He has no prior history of CAD.  He has no shortness of breath weakness numbness tingling otherwise.  EKG per my review interpretation shows sinus rhythm with right bundle branch block which is unchanged from prior EKGs.  For my chart review he had a CT scan of his chest few weeks ago at an outside hospital and had no evidence of PE.  He is not endorsing any shortness of breath.  Overall I suspect this could be muscular or GI related given alcohol use but will check for ACS, electrolyte abnormality, pancreatitis.  Have no concern for dissection.  No concern for PE.  Will get a CBC CMP lipase chest x-ray troponin.  Per my review did not potation labs no significant anemia or electrolyte abnormality or kidney injury or leukocytosis.  Troponin negative x 2.  No pneumonia or pneumothorax on chest x-ray.  Is feeling better.  He is able to eat and drink without any issues.  He is clinically sober.  Alcohol level was 150.  Discharged in good condition.  This chart was dictated using voice recognition software.  Despite best efforts to proofread,  errors can occur which can change the documentation meaning.         Final Clinical Impression(s) / ED Diagnoses Final diagnoses:  Nonspecific chest pain    Rx / DC Orders ED Discharge Orders     None         Virgina Norfolk, DO 12/30/22 2239

## 2022-12-31 ENCOUNTER — Other Ambulatory Visit (HOSPITAL_COMMUNITY): Payer: Self-pay

## 2022-12-31 ENCOUNTER — Emergency Department (HOSPITAL_COMMUNITY)
Admission: EM | Admit: 2022-12-31 | Discharge: 2022-12-31 | Disposition: A | Discharge status: Home / Self Care | Payer: Self-pay | Attending: Emergency Medicine | Admitting: Emergency Medicine

## 2022-12-31 DIAGNOSIS — F1010 Alcohol abuse, uncomplicated: Secondary | ICD-10-CM | POA: Insufficient documentation

## 2022-12-31 DIAGNOSIS — Z5900 Homelessness unspecified: Secondary | ICD-10-CM | POA: Diagnosis not present

## 2022-12-31 MED ORDER — CHLORDIAZEPOXIDE HCL 25 MG PO CAPS
ORAL_CAPSULE | ORAL | 0 refills | Status: DC
  Filled 2022-12-31: qty 10, 3d supply, fill #0

## 2022-12-31 MED ORDER — LORAZEPAM 1 MG PO TABS
1.0000 mg | ORAL_TABLET | Freq: Once | ORAL | Status: AC
  Administered 2022-12-31: 1 mg via ORAL
  Filled 2022-12-31: qty 1

## 2022-12-31 NOTE — ED Triage Notes (Signed)
Pt arrives to ED c/o wanting help and resources to receive detox help with alcohol. Pt reports that he is homeless and is drinking a 6 pack everyday, PT endorses drinking 2 today around 10:00. Pt currently a/o x 4

## 2022-12-31 NOTE — Discharge Instructions (Addendum)
It was a pleasure taking care of you today.  As discussed, do not mix alcohol with the Librium taper.  I have included outpatient resources.  Return to the ER for new or worsening symptoms.

## 2022-12-31 NOTE — Progress Notes (Signed)
CSW received consult for patient due to homelessness and need for medication assistance. PA added homeless shelter resources to patient's AVS - CSW notified RN CM of need for medication assistance.  Edwin Dada, MSW, LCSW Transitions of Care  Clinical Social Worker II [phone]

## 2022-12-31 NOTE — ED Notes (Signed)
PA at bedside.

## 2022-12-31 NOTE — ED Notes (Signed)
TOC saw patient prior to discharge.

## 2022-12-31 NOTE — Discharge Planning (Signed)
RNCM consulted regarding medication assistance of pt unable to afford medications.  RNCM utilized [facility] cash to cover $9.00 co-pay and filled through [facility].  Rx e-scribed to [facility] and delivered to pt prior to discharge home.  No further RNCM needs identified at this time.

## 2022-12-31 NOTE — ED Provider Notes (Signed)
[facility] Provider Note   CSN: [id number] Arrival date & time: 12/31/22  8295     History  Chief Complaint  Patient presents with   Alcohol Problem    Seth Jordan is a 52 y.o. male with a past medical history significant for bipolar disorder, schizoaffective disorder, and alcohol abuse who presents to the ED requesting alcohol detox programs.  Patient is currently homeless.  He states he drinks a 12 pack of beer daily.  Drank around 10 AM yesterday morning where he drank 6 beers.  Denies tremors, hallucinations, seizures.  No history of complicated alcohol withdrawal.  Patient wants to stop drinking.  Denies abdominal pain, nausea, vomiting, or diarrhea.  No chest pain or shortness of breath. No other complaints.   History obtained from patient and past medical records. No interpreter used during encounter.       Home Medications Prior to Admission medications   Medication Sig Start Date End Date Taking? Authorizing Provider  chlordiazePOXIDE (LIBRIUM) 25 MG capsule Take 50mg  PO three times daily for 1 day, then 25-50mg  by mouth twice daily for 1 day, then 25-50mg  by mouth once daily for 1 day. 12/31/22  Yes Seth Stabile, PA-C      Allergies    Patient has no known allergies.    Review of Systems   Review of Systems  Gastrointestinal:  Negative for abdominal pain, nausea and vomiting.  Neurological:  Negative for tremors.  Psychiatric/Behavioral:  Negative for hallucinations.     Physical Exam Updated Vital Signs BP 128/88 (BP Location: Right Arm)   Pulse 83   Temp 97.8 F (36.6 C) (Temporal)   Resp 15   Ht 5\' 7"  (1.702 m)   Wt 66.7 kg   SpO2 100%   BMI 23.02 kg/m  Physical Exam Vitals and nursing note reviewed.  Constitutional:      General: He is not in acute distress.    Appearance: He is not ill-appearing.  HENT:     Head: Normocephalic.  Eyes:     Pupils: Pupils are equal, round, and reactive to  light.  Cardiovascular:     Rate and Rhythm: Normal rate and regular rhythm.     Pulses: Normal pulses.     Heart sounds: Normal heart sounds. No murmur heard.    No friction rub. No gallop.  Pulmonary:     Effort: Pulmonary effort is normal.     Breath sounds: Normal breath sounds.  Abdominal:     General: Abdomen is flat. There is no distension.     Palpations: Abdomen is soft.     Tenderness: There is no abdominal tenderness. There is no guarding or rebound.  Musculoskeletal:        General: Normal range of motion.     Cervical back: Neck supple.  Skin:    General: Skin is warm and dry.  Neurological:     General: No focal deficit present.     Mental Status: He is alert.  Psychiatric:        Mood and Affect: Mood normal.        Behavior: Behavior normal.     ED Results / Procedures / Treatments   Labs (all labs ordered are listed, but only abnormal results are displayed) Labs Reviewed - No data to display  EKG None  Radiology DG Chest Portable 1 View  Result Date: 12/30/2022 CLINICAL DATA:  Chest pain EXAM: PORTABLE CHEST 1 VIEW COMPARISON:  03/06/2013  FINDINGS: The heart size and mediastinal contours are within normal limits. Both lungs are clear. The visualized skeletal structures are unremarkable. IMPRESSION: No active disease. Electronically Signed   By: Helyn Numbers M.D.   On: 12/30/2022 20:24    Procedures Procedures    Medications Ordered in ED Medications  LORazepam (ATIVAN) tablet 1 mg (1 mg Oral Given 12/31/22 0758)    ED Course/ Medical Decision Making/ A&P                                 Medical Decision Making Risk Prescription drug management.   52 year old male presents to the ED requesting detox from alcohol.  Patient drinks roughly 12 beers daily.  Last drank around 10 AM yesterday morning where he drank 6 beers.  No history of complicated alcohol withdrawal.  Upon arrival patient initially tachycardic at 107 however, during initial  evaluation vitals rechecked with a normal heart rate of 83.  No evidence of alcohol withdrawal on exam.  No tremors.  Abdomen soft, nondistended, nontender.  Low suspicion for acute abdomen.  Patient requesting to stop drinking alcohol.  Will prescribe a Librium taper.  Reached out to Seth Jordan to help with medication assistance.  Patient given food.  No other complaints.  9:01 AM Discussed with Seth Jordan with TOC who has arranged for patient to get his librium taper prescription here prior to discharge.   Patient given librium taper in the ED. Advised patient not to drink alcohol while on the medication. No evidence of alcohol withdrawal. Resources given at discharge. Patient stable for discharge. Strict ED precautions discussed with patient. Patient states understanding and agrees to plan. Patient discharged home in no acute distress and stable vitals   Homeless No PCP on file Hx alcohol abuse       Final Clinical Impression(s) / ED Diagnoses Final diagnoses:  Alcohol abuse  Homelessness    Rx / DC Orders ED Discharge Orders          Ordered    chlordiazePOXIDE (LIBRIUM) 25 MG capsule        12/31/22 0706              Seth Stabile, PA-C 12/31/22 1610    Seth Loveless, MD 12/31/22 1246

## 2023-01-09 ENCOUNTER — Other Ambulatory Visit: Payer: Self-pay

## 2023-01-09 ENCOUNTER — Emergency Department (HOSPITAL_COMMUNITY)
Admission: EM | Admit: 2023-01-09 | Discharge: 2023-01-10 | Disposition: A | Discharge status: Home / Self Care | Payer: Self-pay | Attending: Emergency Medicine | Admitting: Emergency Medicine

## 2023-01-09 ENCOUNTER — Encounter (HOSPITAL_COMMUNITY): Payer: Self-pay | Admitting: Emergency Medicine

## 2023-01-09 DIAGNOSIS — R079 Chest pain, unspecified: Secondary | ICD-10-CM | POA: Insufficient documentation

## 2023-01-09 DIAGNOSIS — F1990 Other psychoactive substance use, unspecified, uncomplicated: Secondary | ICD-10-CM | POA: Diagnosis not present

## 2023-01-09 DIAGNOSIS — R000 Tachycardia, unspecified: Secondary | ICD-10-CM | POA: Insufficient documentation

## 2023-01-09 NOTE — ED Triage Notes (Signed)
Patient reports mid chest pain and syncope this evening , respirations unlabored .

## 2023-01-10 ENCOUNTER — Emergency Department (HOSPITAL_COMMUNITY): Payer: Self-pay

## 2023-01-10 LAB — BASIC METABOLIC PANEL
Anion gap: 15 (ref 5–15)
BUN: 38 mg/dL — ABNORMAL HIGH (ref 6–20)
CO2: 17 mmol/L — ABNORMAL LOW (ref 22–32)
Calcium: 9.2 mg/dL (ref 8.9–10.3)
Chloride: 102 mmol/L (ref 98–111)
Creatinine, Ser: 1.99 mg/dL — ABNORMAL HIGH (ref 0.61–1.24)
GFR, Estimated: 39 mL/min — ABNORMAL LOW (ref >60)
Glucose, Bld: 87 mg/dL (ref 70–99)
Potassium: 4.1 mmol/L (ref 3.5–5.1)
Sodium: 134 mmol/L — ABNORMAL LOW (ref 135–145)

## 2023-01-10 LAB — CBC
HCT: 36.5 % — ABNORMAL LOW (ref 39.0–52.0)
Hemoglobin: 12.1 g/dL — ABNORMAL LOW (ref 13.0–17.0)
MCH: 29.5 pg (ref 26.0–34.0)
MCHC: 33.2 g/dL (ref 30.0–36.0)
MCV: 89 fL (ref 80.0–100.0)
Platelets: 301 10*3/uL (ref 150–400)
RBC: 4.1 MIL/uL — ABNORMAL LOW (ref 4.22–5.81)
RDW: 13.1 % (ref 11.5–15.5)
WBC: 6.5 10*3/uL (ref 4.0–10.5)
nRBC: 0 % (ref 0.0–0.2)

## 2023-01-10 LAB — RAPID URINE DRUG SCREEN, HOSP PERFORMED
Amphetamines: POSITIVE — AB
Barbiturates: NOT DETECTED
Benzodiazepines: POSITIVE — AB
Cocaine: POSITIVE — AB
Opiates: NOT DETECTED
Tetrahydrocannabinol: NOT DETECTED

## 2023-01-10 LAB — ETHANOL: Alcohol, Ethyl (B): 20 mg/dL — ABNORMAL HIGH (ref <10)

## 2023-01-10 LAB — TROPONIN I (HIGH SENSITIVITY)
Troponin I (High Sensitivity): 3 ng/L (ref <18)
Troponin I (High Sensitivity): 4 ng/L (ref <18)

## 2023-01-10 MED ORDER — SODIUM CHLORIDE 0.9 % IV BOLUS
1000.0000 mL | Freq: Once | INTRAVENOUS | Status: AC
  Administered 2023-01-10: 1000 mL via INTRAVENOUS

## 2023-01-10 NOTE — Discharge Instructions (Addendum)
Your cardiac evaluation in the ED today was reassuring.  We advise against the use of illicit substances especially amphetamines and crack cocaine as this could exacerbate chest pain and palpitations.  Follow-up with a primary care doctor for further evaluation of your symptoms.

## 2023-01-11 NOTE — ED Provider Notes (Signed)
[facility] EMERGENCY DEPARTMENT AT [facility] Provider Note   CSN: [id number] Arrival date & time: 01/09/23  2343     History  Chief Complaint  Patient presents with   Chest Pain    Seth Jordan is a 52 y.o. male.  52 year old male with history of schizoaffective schizophrenia, bipolar disorder presents to the emergency department for complaints of chest pain.  States that pain was present in the center of his chest and was nonradiating, aching in nature.  Alleges preceding lightheadedness with syncopal event, but denies head trauma or other musculoskeletal pain.  Does state that he drank beer tonight around the time of his symptoms, but he normally drinks more heavily.  No fevers, hemoptysis, vomiting, leg swelling. Presently rates chest pain at 3/10.  The history is provided by the patient. No language interpreter was used.  Chest Pain      Home Medications Prior to Admission medications   Medication Sig Start Date End Date Taking? Authorizing Provider  chlordiazePOXIDE (LIBRIUM) 25 MG capsule Take 50mg  PO three times daily for 1 day, then 25-50mg  by mouth twice daily for 1 day, then 25-50mg  by mouth once daily for 1 day. 12/31/22   Mannie Stabile, PA-C      Allergies    Patient has no known allergies.    Review of Systems   Review of Systems  Cardiovascular:  Positive for chest pain.  Ten systems reviewed and are negative for acute change, except as noted in the HPI.    Physical Exam Updated Vital Signs BP 120/87   Pulse 88   Temp 98.1 F (36.7 C)   Resp 20   SpO2 100%   Physical Exam Vitals and nursing note reviewed.  Constitutional:      General: He is not in acute distress.    Appearance: He is well-developed. He is not diaphoretic.     Comments: Nontoxic appearing and in NAD  HENT:     Head: Normocephalic and atraumatic.  Eyes:     General: No scleral icterus.    Conjunctiva/sclera: Conjunctivae normal.  Cardiovascular:     Rate and  Rhythm: Normal rate and regular rhythm.     Pulses: Normal pulses.  Pulmonary:     Effort: Pulmonary effort is normal. No respiratory distress.     Breath sounds: No stridor. No wheezing.     Comments: Respirations even and unlabored Abdominal:     General: There is no distension.     Palpations: Abdomen is soft.  Musculoskeletal:        General: Normal range of motion.     Cervical back: Normal range of motion.  Skin:    General: Skin is warm and dry.     Coloration: Skin is not pale.     Findings: No erythema or rash.  Neurological:     Mental Status: He is alert and oriented to person, place, and time.     Coordination: Coordination normal.  Psychiatric:        Behavior: Behavior normal.     ED Results / Procedures / Treatments   Labs (all labs ordered are listed, but only abnormal results are displayed) Labs Reviewed  BASIC METABOLIC PANEL - Abnormal; Notable for the following components:      Result Value   Sodium 134 (*)    CO2 17 (*)    BUN 38 (*)    Creatinine, Ser 1.99 (*)    GFR, Estimated 39 (*)    All  other components within normal limits  CBC - Abnormal; Notable for the following components:   RBC 4.10 (*)    Hemoglobin 12.1 (*)    HCT 36.5 (*)    All other components within normal limits  ETHANOL - Abnormal; Notable for the following components:   Alcohol, Ethyl (B) 20 (*)    All other components within normal limits  RAPID URINE DRUG SCREEN, HOSP PERFORMED - Abnormal; Notable for the following components:   Cocaine POSITIVE (*)    Benzodiazepines POSITIVE (*)    Amphetamines POSITIVE (*)    All other components within normal limits  TROPONIN I (HIGH SENSITIVITY)  TROPONIN I (HIGH SENSITIVITY)    EKG EKG Interpretation Date/Time:  Saturday January 10 2023 00:01:19 EST Ventricular Rate:  216 PR Interval:    QRS Duration:  118 QT Interval:  198 QTC Calculation: 375 R Axis:   -85  Text Interpretation: Sinus tachycardia Right bundle branch  block Left anterior fascicular block Bifascicular block Otherwise no significant change When compared with ECG of 30-Dec-2022 19:04, PREVIOUS ECG IS PRESENT Confirmed by Drema Pry [phone]) on 01/10/2023 12:46:57 AM  Radiology DG Chest 2 View  Result Date: 01/10/2023 CLINICAL DATA:  Chest pain, syncope EXAM: CHEST - 2 VIEW COMPARISON:  12/30/2022 FINDINGS: The heart size and mediastinal contours are within normal limits. Both lungs are clear. The visualized skeletal structures are unremarkable. No pneumothorax. IMPRESSION: Normal study. Electronically Signed   By: Charlett Nose M.D.   On: 01/10/2023 00:14    Procedures Procedures    Medications Ordered in ED Medications  sodium chloride 0.9 % bolus 1,000 mL (0 mLs Intravenous Stopped 01/10/23 0305)    ED Course/ Medical Decision Making/ A&P                                 Medical Decision Making Amount and/or Complexity of Data Reviewed Labs: ordered. Radiology: ordered.   This patient presents to the ED for concern of chest pain and LOC, this involves an extensive number of treatment options, and is a complaint that carries with it a high risk of complications and morbidity.  The differential diagnosis includes arrhythmia vs ACS vs substance use vs dehydration vs dissection vs PE   Co morbidities that complicate the patient evaluation  Schizoaffective schizophrenia Bipolar disorder   Additional history obtained:  Additional history obtained from EMS External records from outside source obtained and reviewed including multiple prior evaluations for chest pain and alcohol intoxication. Negative CTA chest on 12/13/2022.   Lab Tests:  I Ordered, and personally interpreted labs.  The pertinent results include:  Ethanol 20. Hgb 12.1. Creatinine 1.99 (1.61 ten days ago). CO2 17 (likely from metabolization of alcohol). UDS positive for cocaine, benzos, amphetamines.    Imaging Studies ordered:  I ordered imaging studies  including CXR  I independently visualized and interpreted imaging which showed no acute cardiopulmonary abnormality. I agree with the radiologist interpretation   Cardiac Monitoring:  The patient was maintained on a cardiac monitor.  I personally viewed and interpreted the cardiac monitored which showed an underlying rhythm of: sinus tachycardia > NSR   Medicines ordered and prescription drug management:  I ordered medication including IVF for hydration  Reevaluation of the patient after these medicines showed that the patient improved I have reviewed the patients home medicines and have made adjustments as needed   Test Considered:  D dimer - however, tachycardia has  resolved with IVF suggesting underlying dehydration. Negative CTA chest 1 month ago. No PE or aortic abnormalities at the time of that study.   Problem List / ED Course:  Patient presents to the emergency department for evaluation of syncope and subsequent chest pain.  Low suspicion for emergent cardiac etiology given reassuring workup today.   EKG is without signs of acute ischemia; largely stable compared to prior. Troponin negative x2.  Chest x-ray without evidence of mediastinal widening to suggest dissection.  No aortic abnormalities noted on chest CTA 1 month ago. Also no pneumothorax, pneumonia, pleural effusion.   Pulmonary embolus further considered; however, patient without tachypnea, dyspnea, hypoxia.  Tachycardia resolved with IVF suggesting underlying dehydration, possibly substance related.  UDS positive for cocaine, amphetamines, benzodiazepines. Substance use certainly could provoke alteration in consciousness. Multiple previous work ups for chest pain in an ED setting, all of which have been reassuring.   Reevaluation:  After the interventions noted above, I reevaluated the patient and found that they have : remained stable   Social Determinants of Health:  Polysubstance  abuse   Dispostion:  After consideration of the diagnostic results and the patients response to treatment, I feel that the patent would benefit from PCP follow up. Advised against the use of illicit substances. Return precautions discussed and provided. Patient discharged in stable condition with no unaddressed concerns.          Final Clinical Impression(s) / ED Diagnoses Final diagnoses:  Nonspecific chest pain  Polysubstance use disorder    Rx / DC Orders ED Discharge Orders     None         Antony Madura, PA-C 01/11/23 1914    Nira Conn, MD 01/11/23 [phone]

## 2023-01-13 ENCOUNTER — Emergency Department (HOSPITAL_COMMUNITY)
Admission: EM | Admit: 2023-01-13 | Discharge: 2023-01-14 | Disposition: A | Discharge status: Other Acute Inpatient Hospital | Payer: No Payment, Other | Attending: Emergency Medicine | Admitting: Emergency Medicine

## 2023-01-13 ENCOUNTER — Other Ambulatory Visit: Payer: Self-pay

## 2023-01-13 ENCOUNTER — Encounter (HOSPITAL_COMMUNITY): Payer: Self-pay

## 2023-01-13 DIAGNOSIS — F10129 Alcohol abuse with intoxication, unspecified: Secondary | ICD-10-CM | POA: Insufficient documentation

## 2023-01-13 DIAGNOSIS — Y906 Blood alcohol level of 120-199 mg/100 ml: Secondary | ICD-10-CM | POA: Diagnosis not present

## 2023-01-13 DIAGNOSIS — F322 Major depressive disorder, single episode, severe without psychotic features: Secondary | ICD-10-CM | POA: Insufficient documentation

## 2023-01-13 DIAGNOSIS — R45851 Suicidal ideations: Secondary | ICD-10-CM

## 2023-01-13 DIAGNOSIS — F10920 Alcohol use, unspecified with intoxication, uncomplicated: Secondary | ICD-10-CM

## 2023-01-13 LAB — ACETAMINOPHEN LEVEL: Acetaminophen (Tylenol), Serum: <10 ug/mL — ABNORMAL LOW (ref 10–30)

## 2023-01-13 LAB — COMPREHENSIVE METABOLIC PANEL
ALT: 22 U/L (ref 0–44)
AST: 44 U/L — ABNORMAL HIGH (ref 15–41)
Albumin: 3.6 g/dL (ref 3.5–5.0)
Alkaline Phosphatase: 67 U/L (ref 38–126)
Anion gap: 11 (ref 5–15)
BUN: 19 mg/dL (ref 6–20)
CO2: 22 mmol/L (ref 22–32)
Calcium: 9.4 mg/dL (ref 8.9–10.3)
Chloride: 104 mmol/L (ref 98–111)
Creatinine, Ser: 1.48 mg/dL — ABNORMAL HIGH (ref 0.61–1.24)
GFR, Estimated: 56 mL/min — ABNORMAL LOW (ref >60)
Glucose, Bld: 84 mg/dL (ref 70–99)
Potassium: 3.8 mmol/L (ref 3.5–5.1)
Sodium: 137 mmol/L (ref 135–145)
Total Bilirubin: 0.6 mg/dL (ref <1.2)
Total Protein: 7.2 g/dL (ref 6.5–8.1)

## 2023-01-13 LAB — CBC
HCT: 37.3 % — ABNORMAL LOW (ref 39.0–52.0)
Hemoglobin: 12.3 g/dL — ABNORMAL LOW (ref 13.0–17.0)
MCH: 29.4 pg (ref 26.0–34.0)
MCHC: 33 g/dL (ref 30.0–36.0)
MCV: 89 fL (ref 80.0–100.0)
Platelets: 382 10*3/uL (ref 150–400)
RBC: 4.19 MIL/uL — ABNORMAL LOW (ref 4.22–5.81)
RDW: 13.1 % (ref 11.5–15.5)
WBC: 9.2 10*3/uL (ref 4.0–10.5)
nRBC: 0 % (ref 0.0–0.2)

## 2023-01-13 LAB — ETHANOL: Alcohol, Ethyl (B): 147 mg/dL — ABNORMAL HIGH (ref <10)

## 2023-01-13 LAB — SALICYLATE LEVEL: Salicylate Lvl: <7 mg/dL — ABNORMAL LOW (ref 7.0–30.0)

## 2023-01-13 MED ORDER — THIAMINE MONONITRATE 100 MG PO TABS
100.0000 mg | ORAL_TABLET | Freq: Every day | ORAL | Status: DC
  Administered 2023-01-13 – 2023-01-14 (×2): 100 mg via ORAL
  Filled 2023-01-13 (×2): qty 1

## 2023-01-13 MED ORDER — LORAZEPAM 2 MG/ML IJ SOLN: 1.0000 mg | INTRAMUSCULAR | Status: DC | PRN

## 2023-01-13 MED ORDER — LORAZEPAM 1 MG PO TABS: 1.0000 mg | ORAL_TABLET | ORAL | Status: DC | PRN

## 2023-01-13 MED ORDER — FOLIC ACID 1 MG PO TABS
1.0000 mg | ORAL_TABLET | Freq: Every day | ORAL | Status: DC
  Administered 2023-01-13 – 2023-01-14 (×2): 1 mg via ORAL
  Filled 2023-01-13 (×2): qty 1

## 2023-01-13 MED ORDER — THIAMINE HCL 100 MG/ML IJ SOLN: 100.0000 mg | Freq: Every day | INTRAMUSCULAR | Status: DC

## 2023-01-13 MED ORDER — ADULT MULTIVITAMIN W/MINERALS CH
1.0000 | ORAL_TABLET | Freq: Every day | ORAL | Status: DC
  Administered 2023-01-13 – 2023-01-14 (×2): 1 via ORAL
  Filled 2023-01-13 (×2): qty 1

## 2023-01-13 NOTE — ED Provider Notes (Signed)
[facility] AT [facility] Provider Note   CSN: [id number] Arrival date & time: 01/13/23  0017     History  Chief Complaint  Patient presents with   Suicidal    Seth Jordan is a 52 y.o. male, history of schizoaffective disorder, who presents to the ED secondary to suicidal ideation as well as concern for alcohol abuse.  He states that he drinks about 12 beers today, and is concerned that he is abusing alcohol, is requesting help.  He states he is so depressed, that he plans on jumping off a bridge, and to the oncoming traffic to end it all.  States that he has attempted suicide before, with pill ingestion, but notes that this time he wants to end it all.  Denies any other drug use.  No auditory or visual hallucinations.  Home Medications Prior to Admission medications   Medication Sig Start Date End Date Taking? Authorizing Provider  chlordiazePOXIDE (LIBRIUM) 25 MG capsule Take 50mg  PO three times daily for 1 day, then 25-50mg  by mouth twice daily for 1 day, then 25-50mg  by mouth once daily for 1 day. Patient not taking: Reported on 01/13/2023 12/31/22   Mannie Stabile, PA-C      Allergies    Patient has no known allergies.    Review of Systems   Review of Systems  Psychiatric/Behavioral:  Positive for suicidal ideas. Negative for hallucinations.     Physical Exam Updated Vital Signs BP 124/86 (BP Location: Left Arm)   Pulse 80   Temp 98.8 F (37.1 C) (Oral)   Resp 18   SpO2 100%  Physical Exam Vitals and nursing note reviewed.  Constitutional:      General: He is not in acute distress.    Appearance: He is well-developed.  HENT:     Head: Normocephalic and atraumatic.  Eyes:     Conjunctiva/sclera: Conjunctivae normal.  Cardiovascular:     Rate and Rhythm: Normal rate and regular rhythm.     Heart sounds: No murmur heard. Pulmonary:     Effort: Pulmonary effort is normal. No respiratory distress.     Breath sounds: Normal  breath sounds.  Abdominal:     Palpations: Abdomen is soft.     Tenderness: There is no abdominal tenderness.  Musculoskeletal:        General: No swelling.     Cervical back: Neck supple.  Skin:    General: Skin is warm and dry.     Capillary Refill: Capillary refill takes less than 2 seconds.  Neurological:     Mental Status: He is alert.  Psychiatric:        Mood and Affect: Mood normal.     ED Results / Procedures / Treatments   Labs (all labs ordered are listed, but only abnormal results are displayed) Labs Reviewed  COMPREHENSIVE METABOLIC PANEL - Abnormal; Notable for the following components:      Result Value   Creatinine, Ser 1.48 (*)    AST 44 (*)    GFR, Estimated 56 (*)    All other components within normal limits  ETHANOL - Abnormal; Notable for the following components:   Alcohol, Ethyl (B) 147 (*)    All other components within normal limits  SALICYLATE LEVEL - Abnormal; Notable for the following components:   Salicylate Lvl <7.0 (*)    All other components within normal limits  ACETAMINOPHEN LEVEL - Abnormal; Notable for the following components:   Acetaminophen (Tylenol), Serum <10 (*)  All other components within normal limits  CBC - Abnormal; Notable for the following components:   RBC 4.19 (*)    Hemoglobin 12.3 (*)    HCT 37.3 (*)    All other components within normal limits  RAPID URINE DRUG SCREEN, HOSP PERFORMED    EKG None  Radiology No results found.  Procedures Procedures    Medications Ordered in ED Medications  LORazepam (ATIVAN) tablet 1-4 mg (has no administration in time range)    Or  LORazepam (ATIVAN) injection 1-4 mg (has no administration in time range)  thiamine (VITAMIN B1) tablet 100 mg (has no administration in time range)    Or  thiamine (VITAMIN B1) injection 100 mg (has no administration in time range)  folic acid (FOLVITE) tablet 1 mg (has no administration in time range)  multivitamin with minerals tablet 1  tablet (has no administration in time range)    ED Course/ Medical Decision Making/ A&P                                 Medical Decision Making Patient is a 52 year old male, who is suicidal, with plan, jump off a bridge.  He states he is depressed because he uses alcohol, denies any kind of history of withdrawal, or seizures with withdrawing.  We will obtain basic labs, to further evaluate.  He has no other complaints other than his suicidal ideation.  Amount and/or Complexity of Data Reviewed Labs: ordered.    Details: Alcohol at 147 Discussion of management or test interpretation with external provider(s): Discussed with patient, his labs are reassuring except for positive for alcohol, which goes along with his a history of alcohol abuse, he is not currently withdrawing, CIWA in place.  To have psych further evaluate, given he is suicidal, have sitter ordered.  Medically cleared at this time.  Risk OTC drugs. Prescription drug management.   Final Clinical Impression(s) / ED Diagnoses Final diagnoses:  Alcoholic intoxication without complication (HCC)  Suicidal ideation    Rx / DC Orders ED Discharge Orders     None         Rumi Kolodziej, Harley Alto, PA 01/13/23 1449    Lonell Grandchild, MD 01/14/23 1320

## 2023-01-13 NOTE — ED Notes (Signed)
Sitting in-front LOCK 10

## 2023-01-13 NOTE — ED Notes (Signed)
Pt dressed out and wanded by security.  

## 2023-01-13 NOTE — Consult Note (Signed)
BH ED ASSESSMENT   Reason for Consult:  SI Referring Physician:  Small Patient Identification: Seth Jordan MRN:  [id number] ED Chief Complaint: MDD (major depressive disorder), single episode, severe (HCC)  Diagnosis:  Principal Problem:   MDD (major depressive disorder), single episode, severe (HCC) Active Problems:   Suicidal ideations   ED Assessment Time Calculation: Start Time: 1540 Stop Time: 1610 Total Time in Minutes (Assessment Completion): 30   HPI:   Seth Jordan is a 52 y.o. male patient who presents to the ED secondary to suicidal ideation as well as concern for alcohol abuse.  He states that he drinks about 12 beers today, and is concerned that he is abusing alcohol, is requesting help.  He states he is so depressed, that he plans on jumping off a bridge, and to the oncoming traffic to end it all.  States that he has attempted suicide before, with pill ingestion, but notes that this time he wants to end it all.  Denies any other drug use.  No auditory or visual hallucinations. .  Subjective:   Patient seen at [facility], ED for psychiatric assessment.  Patient appears depressed and endorses hopelessness, low self-esteem, anhedonia, isolation, overall depressed mood, and active suicidal ideations with a plan.  Patient stated around 2 years ago he tried overdosing on pills, he did not seek hospital treatment.  Today he states he would probably try and jump off a bridge, walk into traffic, or try to overdose again.  He reports he has no reason to live.  He is struggling with alcohol addiction, has had difficulty maintaining sobriety.  Difficulty finding shelter, he has been homeless for years but recently has been trying to get into different shelters but most do not have availability.  He also has no insurance he has been denied for multiple substance abuse programs.  He feels like he has nowhere else to turn, and would rather die.  He is unable to identify any reasons to  live.  He denies homicidal ideations.  Denies any auditory or visual hallucinations.  He does admit to occasional marijuana use, denies any other illicit substances.  UDS is still pending.  He does endorse daily alcohol use of around 12 beers per day for the last 30 years.  Patient stated he has had occasional periods of sobriety, but no sobriety over the past 3-5 years.  Patient denies any history of withdrawal seizures.  His BAL upon ED arrival is 147.  Patient is willing to try inpatient psychiatric treatment.  Patient denies ever receiving psych IP treatment before.  Patient is wary about starting an antidepressant, he is wanting to hold off on that at this time.  Patient denies any current therapist or outpatient psychiatrist.  Patient is not able to contract for safety at this time and states he will try and kill himself if discharged from the ED.  Will recommend inpatient psychiatric treatment at this time.  Past Psychiatric History:  MDD, 1 previous suicide attempt  Risk to Self or Others: Is the patient at risk to self? Yes Has the patient been a risk to self in the past 6 months? Yes Has the patient been a risk to self within the distant past? Yes Is the patient a risk to others? No Has the patient been a risk to others in the past 6 months? No Has the patient been a risk to others within the distant past? No  [place] Scale:  Flowsheet Row ED from 01/13/2023 in [facility] Department at [facility] ED from 01/09/2023 in [facility] Emergency Department at [facility] ED from 12/31/2022 in [facility] Emergency Department at [facility]  C-SSRS RISK CATEGORY High Risk No Risk No Risk       Substance Abuse:   THC, Alcohol  Past Medical History:  Past Medical History:  Diagnosis Date   Bipolar disease, chronic (HCC)    Schizo affective schizophrenia (HCC)    History reviewed. No pertinent surgical history. Family History: History reviewed. No  pertinent family history. Family Psychiatric  History: unknown Social History:  Social History   Substance and Sexual Activity  Alcohol Use Yes     Social History   Substance and Sexual Activity  Drug Use No    Social History   Socioeconomic History   Marital status: Single    Spouse name: Not on file   Number of children: Not on file   Years of education: Not on file   Highest education level: Not on file  Occupational History   Not on file  Tobacco Use   Smoking status: Never    Passive exposure: Current   Smokeless tobacco: Never  Vaping Use   Vaping status: Never Used  Substance and Sexual Activity   Alcohol use: Yes   Drug use: No   Sexual activity: Not Currently  Other Topics Concern   Not on file  Social History Narrative   Not on file   Social Determinants of Health   Financial Resource Strain: Not on file  Food Insecurity: Not on file  Transportation Needs: Not on file  Physical Activity: Not on file  Stress: Not on file  Social Connections: Unknown (11/12/2022)   Received from [facility]   Social Network    Social Network: Not on file   Additional Social History:    Allergies:  No Known Allergies  Labs:  Results for orders placed or performed during the hospital encounter of 01/13/23 (from the past 48 hour(s))  Comprehensive metabolic panel     Status: Abnormal   Collection Time: 01/13/23  1:59 AM  Result Value Ref Range   Sodium 137 135 - 145 mmol/L   Potassium 3.8 3.5 - 5.1 mmol/L   Chloride 104 98 - 111 mmol/L   CO2 22 22 - 32 mmol/L   Glucose, Bld 84 70 - 99 mg/dL    Comment: Glucose reference range applies only to samples taken after fasting for at least 8 hours.   BUN 19 6 - 20 mg/dL   Creatinine, Ser 9.14 (H) 0.61 - 1.24 mg/dL   Calcium 9.4 8.9 - 78.2 mg/dL   Total Protein 7.2 6.5 - 8.1 g/dL   Albumin 3.6 3.5 - 5.0 g/dL   AST 44 (H) 15 - 41 U/L   ALT 22 0 - 44 U/L   Alkaline Phosphatase 67 38 - 126 U/L   Total Bilirubin 0.6  <1.2 mg/dL   GFR, Estimated 56 (L) >60 mL/min    Comment: (NOTE) Calculated using the CKD-EPI Creatinine Equation (2021)    Anion gap 11 5 - 15    Comment: Performed at [facility], [address] [place], [place] [address]  Ethanol     Status: Abnormal   Collection Time: 01/13/23  1:59 AM  Result Value Ref Range   Alcohol, Ethyl (B) 147 (H) <10 mg/dL    Comment: (NOTE) Lowest detectable limit for serum alcohol is 10 mg/dL.  For medical purposes only.  Performed at [facility], [address], [place], [place] [address]   Salicylate level     Status: Abnormal   Collection Time: 01/13/23  1:59 AM  Result Value Ref Range   Salicylate Lvl <7.0 (L) 7.0 - 30.0 mg/dL    Comment: Performed at [facility], [address], [place], [place] [address]  Acetaminophen level     Status: Abnormal   Collection Time: 01/13/23  1:59 AM  Result Value Ref Range   Acetaminophen (Tylenol), Serum <10 (L) 10 - 30 ug/mL    Comment: (NOTE) Therapeutic concentrations vary significantly. A range of 10-30 ug/mL  may be an effective concentration for many patients. However, some  are best treated at concentrations outside of this range. Acetaminophen concentrations >150 ug/mL at 4 hours after ingestion  and >50 ug/mL at 12 hours after ingestion are often associated with  toxic reactions.  Performed at [facility], [address] [place], [place] [address]   cbc     Status: Abnormal   Collection Time: 01/13/23  1:59 AM  Result Value Ref Range   WBC 9.2 4.0 - 10.5 K/uL   RBC 4.19 (L) 4.22 - 5.81 MIL/uL   Hemoglobin 12.3 (L) 13.0 - 17.0 g/dL   HCT 96.2 (L) 95.2 - 84.1 %   MCV 89.0 80.0 - 100.0 fL   MCH 29.4 26.0 - 34.0 pg   MCHC 33.0 30.0 - 36.0 g/dL   RDW 32.4 40.1 - 02.7 %   Platelets 382 150 - 400 K/uL   nRBC 0.0 0.0 - 0.2 %    Comment: Performed at [facility], [address] [place], [place] [address]    Current Facility-Administered Medications   Medication Dose Route Frequency Provider Last Rate Last Admin   folic acid (FOLVITE) tablet 1 mg  1 mg Oral Daily Small, Brooke L, PA   1 mg at 01/13/23 1456   LORazepam (ATIVAN) tablet 1-4 mg  1-4 mg Oral Q1H PRN Small, Brooke L, PA       Or   LORazepam (ATIVAN) injection 1-4 mg  1-4 mg Intravenous Q1H PRN Small, Brooke L, PA       multivitamin with minerals tablet 1 tablet  1 tablet Oral Daily Small, Brooke L, PA   1 tablet at 01/13/23 1456   thiamine (VITAMIN B1) tablet 100 mg  100 mg Oral Daily Small, Brooke L, PA   100 mg at 01/13/23 1456   Or   thiamine (VITAMIN B1) injection 100 mg  100 mg Intravenous Daily Small, Brooke L, PA       Current Outpatient Medications  Medication Sig Dispense Refill   chlordiazePOXIDE (LIBRIUM) 25 MG capsule Take 50mg  PO three times daily for 1 day, then 25-50mg  by mouth twice daily for 1 day, then 25-50mg  by mouth once daily for 1 day. (Patient not taking: Reported on 01/13/2023) 10 capsule 0    Musculoskeletal: Strength & Muscle Tone: within normal limits Gait & Station: normal Patient leans: N/A   Psychiatric Specialty Exam: Presentation  General Appearance:  Appropriate for Environment  Eye Contact: Good  Speech: Clear and Coherent  Speech Volume: Normal  Handedness: Right   Mood and Affect  Mood: Depressed  Affect: Congruent   Thought Process  Thought Processes: Coherent  Descriptions of Associations:Intact  Orientation:Full (Time, Place and Person)  Thought Content:WDL  History of Schizophrenia/Schizoaffective disorder:No data recorded Duration of Psychotic Symptoms:No data recorded Hallucinations:Hallucinations: None  Ideas of  Reference:None  Suicidal Thoughts:Suicidal Thoughts: Yes, Active SI Active Intent and/or Plan: With Intent; With Plan  Homicidal Thoughts:Homicidal Thoughts: No   Sensorium  Memory: Immediate Good; Recent Good  Judgment: Fair  Insight: Fair   Executive Functions   Concentration: Good  Attention Span: Good  Recall: Good  Fund of Knowledge: Good  Language: Good   Psychomotor Activity  Psychomotor Activity: Psychomotor Activity: Normal   Assets  Assets: Desire for Improvement; Communication Skills; Physical Health    Sleep  Sleep: Sleep: Fair   Physical Exam: Physical Exam Neurological:     Mental Status: He is alert and oriented to person, place, and time.  Psychiatric:        Attention and Perception: Attention normal.        Mood and Affect: Mood is depressed.        Speech: Speech normal.        Behavior: Behavior is cooperative.        Thought Content: Thought content includes suicidal ideation. Thought content includes suicidal plan.    Review of Systems  Psychiatric/Behavioral:  Positive for depression, substance abuse and suicidal ideas.    Blood pressure 124/86, pulse 80, temperature 98.8 F (37.1 C), temperature source Oral, resp. rate 18, SpO2 100%. There is no height or weight on file to calculate BMI.  Medical Decision Making: Will recommend inpatient psychiatric treatment. Pt is not wanting to start any scheduled psychiatric medications this time.   - CIWA protocol with PRN Ativan taper -  Disposition:  recommend IP treatment  Eligha Bridegroom, NP 01/13/2023 4:01 PM

## 2023-01-13 NOTE — ED Notes (Signed)
Pt found to have his cell phone and shoes still on. Belongings will be added to rest of belongings

## 2023-01-13 NOTE — Progress Notes (Addendum)
Pt was accepted to [facility] TODAY 01/13/2023; Bed Assignment 304-2 PENDING EKG  Pt meets inpatient criteria per Madelin Rear  Attending Physician will be Dr. Phineas Inches, MD  Report can be called to:  -Adult unit: [phone]  Pt can arrive after:2300  Care Team notified: Day [facility] Danika Riley,RN,[facility] Kim Brooks,RN, [place] Davis,RN, Brooke Small,PA, Mikaela [place], [place],RN, Percival [place], Tunisha MebaneLCSWA   [place], [place] 01/13/2023 @ 8:39 PM

## 2023-01-13 NOTE — ED Notes (Signed)
Two bags of belongings found in purple locker room. Placed in locker 12.

## 2023-01-13 NOTE — ED Triage Notes (Signed)
Pt arrived via POV stating that he wants to kill himself by cutting himself open because he wants to get into a rehab program and no one will accept him.

## 2023-01-14 ENCOUNTER — Other Ambulatory Visit: Payer: Self-pay

## 2023-01-14 ENCOUNTER — Inpatient Hospital Stay (HOSPITAL_COMMUNITY)
Admission: AD | Admit: 2023-01-14 | Discharge: 2023-01-22 | DRG: 885 | Disposition: A | Discharge status: Home / Self Care | Payer: No Typology Code available for payment source | Source: Intra-hospital | Attending: Psychiatry | Admitting: Psychiatry

## 2023-01-14 ENCOUNTER — Encounter (HOSPITAL_COMMUNITY): Payer: Self-pay | Admitting: Nurse Practitioner

## 2023-01-14 DIAGNOSIS — E538 Deficiency of other specified B group vitamins: Secondary | ICD-10-CM | POA: Diagnosis present

## 2023-01-14 DIAGNOSIS — F10239 Alcohol dependence with withdrawal, unspecified: Secondary | ICD-10-CM | POA: Diagnosis present

## 2023-01-14 DIAGNOSIS — F1514 Other stimulant abuse with stimulant-induced mood disorder: Secondary | ICD-10-CM | POA: Diagnosis present

## 2023-01-14 DIAGNOSIS — F10939 Alcohol use, unspecified with withdrawal, unspecified: Secondary | ICD-10-CM | POA: Insufficient documentation

## 2023-01-14 DIAGNOSIS — F259 Schizoaffective disorder, unspecified: Secondary | ICD-10-CM | POA: Diagnosis present

## 2023-01-14 DIAGNOSIS — Z5941 Food insecurity: Secondary | ICD-10-CM

## 2023-01-14 DIAGNOSIS — Z5982 Transportation insecurity: Secondary | ICD-10-CM | POA: Diagnosis not present

## 2023-01-14 DIAGNOSIS — F17210 Nicotine dependence, cigarettes, uncomplicated: Secondary | ICD-10-CM | POA: Diagnosis present

## 2023-01-14 DIAGNOSIS — E559 Vitamin D deficiency, unspecified: Secondary | ICD-10-CM | POA: Diagnosis present

## 2023-01-14 DIAGNOSIS — F064 Anxiety disorder due to known physiological condition: Secondary | ICD-10-CM | POA: Diagnosis present

## 2023-01-14 DIAGNOSIS — R45851 Suicidal ideations: Secondary | ICD-10-CM | POA: Diagnosis present

## 2023-01-14 DIAGNOSIS — F322 Major depressive disorder, single episode, severe without psychotic features: Secondary | ICD-10-CM | POA: Diagnosis not present

## 2023-01-14 DIAGNOSIS — F1210 Cannabis abuse, uncomplicated: Secondary | ICD-10-CM | POA: Diagnosis present

## 2023-01-14 DIAGNOSIS — Z5900 Homelessness unspecified: Secondary | ICD-10-CM

## 2023-01-14 DIAGNOSIS — F1414 Cocaine abuse with cocaine-induced mood disorder: Secondary | ICD-10-CM | POA: Diagnosis present

## 2023-01-14 DIAGNOSIS — F1994 Other psychoactive substance use, unspecified with psychoactive substance-induced mood disorder: Secondary | ICD-10-CM | POA: Diagnosis not present

## 2023-01-14 DIAGNOSIS — Z79899 Other long term (current) drug therapy: Secondary | ICD-10-CM

## 2023-01-14 DIAGNOSIS — F1914 Other psychoactive substance abuse with psychoactive substance-induced mood disorder: Secondary | ICD-10-CM | POA: Diagnosis present

## 2023-01-14 DIAGNOSIS — I4510 Unspecified right bundle-branch block: Secondary | ICD-10-CM | POA: Diagnosis present

## 2023-01-14 DIAGNOSIS — Z9151 Personal history of suicidal behavior: Secondary | ICD-10-CM | POA: Diagnosis not present

## 2023-01-14 DIAGNOSIS — F1020 Alcohol dependence, uncomplicated: Secondary | ICD-10-CM | POA: Insufficient documentation

## 2023-01-14 HISTORY — DX: Deficiency of other specified B group vitamins: E53.8

## 2023-01-14 HISTORY — DX: Vitamin D deficiency, unspecified: E55.9

## 2023-01-14 MED ORDER — ACETAMINOPHEN 325 MG PO TABS: 650.0000 mg | ORAL_TABLET | Freq: Four times a day (QID) | ORAL | Status: DC | PRN

## 2023-01-14 MED ORDER — LORAZEPAM 2 MG/ML IJ SOLN: 2.0000 mg | Freq: Three times a day (TID) | INTRAMUSCULAR | Status: DC | PRN

## 2023-01-14 MED ORDER — HYDROXYZINE HCL 25 MG PO TABS
25.0000 mg | ORAL_TABLET | Freq: Four times a day (QID) | ORAL | Status: AC | PRN
  Administered 2023-01-14 – 2023-01-16 (×3): 25 mg via ORAL
  Filled 2023-01-14 (×2): qty 1

## 2023-01-14 MED ORDER — ADULT MULTIVITAMIN W/MINERALS CH
1.0000 | ORAL_TABLET | Freq: Every day | ORAL | Status: DC
  Administered 2023-01-15 – 2023-01-22 (×8): 1 via ORAL
  Filled 2023-01-14 (×9): qty 1

## 2023-01-14 MED ORDER — ALUM & MAG HYDROXIDE-SIMETH 200-200-20 MG/5ML PO SUSP: 30.0000 mL | ORAL | Status: DC | PRN

## 2023-01-14 MED ORDER — HALOPERIDOL LACTATE 5 MG/ML IJ SOLN: 10.0000 mg | Freq: Three times a day (TID) | INTRAMUSCULAR | Status: DC | PRN

## 2023-01-14 MED ORDER — FOLIC ACID 1 MG PO TABS
1.0000 mg | ORAL_TABLET | Freq: Every day | ORAL | Status: DC
  Administered 2023-01-14 – 2023-01-18 (×5): 1 mg via ORAL
  Filled 2023-01-14 (×6): qty 1

## 2023-01-14 MED ORDER — TRAZODONE HCL 50 MG PO TABS
50.0000 mg | ORAL_TABLET | Freq: Every evening | ORAL | Status: DC | PRN
  Administered 2023-01-14 – 2023-01-21 (×8): 50 mg via ORAL
  Filled 2023-01-14 (×3): qty 1
  Filled 2023-01-14: qty 14
  Filled 2023-01-14 (×5): qty 1

## 2023-01-14 MED ORDER — LORAZEPAM 1 MG PO TABS: 1.0000 mg | ORAL_TABLET | ORAL | Status: DC | PRN

## 2023-01-14 MED ORDER — HALOPERIDOL LACTATE 5 MG/ML IJ SOLN: 5.0000 mg | Freq: Three times a day (TID) | INTRAMUSCULAR | Status: DC | PRN

## 2023-01-14 MED ORDER — ONDANSETRON 4 MG PO TBDP: 4.0000 mg | ORAL_TABLET | Freq: Four times a day (QID) | ORAL | Status: AC | PRN

## 2023-01-14 MED ORDER — LORAZEPAM 2 MG/ML IJ SOLN: 1.0000 mg | INTRAMUSCULAR | Status: DC | PRN

## 2023-01-14 MED ORDER — DIPHENHYDRAMINE HCL 50 MG/ML IJ SOLN: 50.0000 mg | Freq: Three times a day (TID) | INTRAMUSCULAR | Status: DC | PRN

## 2023-01-14 MED ORDER — HALOPERIDOL 5 MG PO TABS: 5.0000 mg | ORAL_TABLET | Freq: Three times a day (TID) | ORAL | Status: DC | PRN

## 2023-01-14 MED ORDER — THIAMINE HCL 100 MG/ML IJ SOLN: 100.0000 mg | Freq: Every day | INTRAMUSCULAR | Status: DC

## 2023-01-14 MED ORDER — MAGNESIUM HYDROXIDE 400 MG/5ML PO SUSP: 30.0000 mL | Freq: Every day | ORAL | Status: DC | PRN

## 2023-01-14 MED ORDER — LORAZEPAM 1 MG PO TABS: 1.0000 mg | ORAL_TABLET | Freq: Four times a day (QID) | ORAL | Status: AC | PRN

## 2023-01-14 MED ORDER — HYDROXYZINE HCL 25 MG PO TABS
25.0000 mg | ORAL_TABLET | Freq: Three times a day (TID) | ORAL | Status: DC | PRN
  Administered 2023-01-17 – 2023-01-21 (×5): 25 mg via ORAL
  Filled 2023-01-14 (×4): qty 1
  Filled 2023-01-14: qty 20
  Filled 2023-01-14 (×2): qty 1

## 2023-01-14 MED ORDER — DIPHENHYDRAMINE HCL 25 MG PO CAPS: 50.0000 mg | ORAL_CAPSULE | Freq: Three times a day (TID) | ORAL | Status: DC | PRN

## 2023-01-14 MED ORDER — VITAMIN B-1 100 MG PO TABS
100.0000 mg | ORAL_TABLET | Freq: Every day | ORAL | Status: DC
  Administered 2023-01-15 – 2023-01-22 (×8): 100 mg via ORAL
  Filled 2023-01-14 (×9): qty 1

## 2023-01-14 MED ORDER — LOPERAMIDE HCL 2 MG PO CAPS: 2.0000 mg | ORAL_CAPSULE | ORAL | Status: AC | PRN

## 2023-01-14 NOTE — Progress Notes (Signed)
Pt has been accepted to [facility] on 01/14/2023 Bed assignment: 301-2  Pt meets inpatient criteria per: Arsenio Loader NP  Attending Physician will be: Dr. Sarita Bottom, MD   Report can be called to: Adult unit: [phone]  Pt can arrive after DC   Care Team Notified: [facility]  Rona Ravens RN,, Arsenio Loader NP, Roseanne Reno RN, Roddie Mc RN, Brooke Small PA   [place] Montrel Donahoe LCSW-A   01/14/2023 10:31 AM

## 2023-01-14 NOTE — Progress Notes (Signed)
Admissions Note:   Patient is a 52 y.o. M who presented to ED for depression and suicidal ideation. Patient was admitted to [facility] Adult Unit. Patient had plan to jump off of a bridge. Patient stated "I want to get back on my feet and quit alcohol and find a job." Patient currently endorses passive SI and verbally contracts for safety. Patient denies HI and AVH. Patient rates depression 5/10. Patient states he has been unhoused for 20 years.  Skin assessment was completed with Minerva Areola, RN and found to be WNL. Belongings were searched per unit policy and banned items were placed in locker #40. Consents were signed and patient packet was reviewed. Patient was oriented to the unit and Q 15 minute safety checks were initiated.

## 2023-01-14 NOTE — Progress Notes (Signed)
   01/14/23 1500  Psych Admission Type (Psych Patients Only)  Admission Status Voluntary  Psychosocial Assessment  Patient Complaints Depression;Substance abuse  Eye Contact Fair  Facial Expression Animated;Sad  Affect Depressed  Speech Logical/coherent;Soft  Interaction Assertive  Motor Activity Other (Comment) (WNL)  Appearance/Hygiene Disheveled  Behavior Characteristics Cooperative  Mood Depressed  Thought Process  Coherency WDL  Content WDL  Delusions None reported or observed  Perception WDL  Hallucination None reported or observed  Judgment Impaired  Confusion None  Danger to Self  Current suicidal ideation? Passive  Self-Injurious Behavior No self-injurious ideation or behavior indicators observed or expressed   Agreement Not to Harm Self Yes  Description of Agreement verbal  Danger to Others  Danger to Others None reported or observed

## 2023-01-14 NOTE — Plan of Care (Signed)
  Problem: Education: Goal: Knowledge of [facility] Education information/materials will improve Outcome: Progressing Goal: Emotional status will improve Outcome: Progressing Goal: Mental status will improve Outcome: Progressing Goal: Verbalization of understanding the information provided will improve Outcome: Progressing   

## 2023-01-14 NOTE — ED Notes (Signed)
VOL  

## 2023-01-14 NOTE — Tx Team (Signed)
Initial Treatment Plan 01/14/2023 3:40 PM Seth Jordan [id number]    PATIENT STRESSORS: Substance abuse     PATIENT STRENGTHS: Capable of independent living  Physical Health    PATIENT IDENTIFIED PROBLEMS: " I got really depressed and wanted to jump off of a bridge."  "I want help to get back on my feet and get a job."                   DISCHARGE CRITERIA:  Adequate post-discharge living arrangements Improved stabilization in mood, thinking, and/or behavior  PRELIMINARY DISCHARGE PLAN: Attend 12-step recovery group Outpatient therapy  PATIENT/FAMILY INVOLVEMENT: This treatment plan has been presented to and reviewed with the patient, [name].  The patient has been given the opportunity to ask questions and make suggestions.  Karn Pickler, RN 01/14/2023, 3:40 PM

## 2023-01-14 NOTE — Progress Notes (Signed)
D) Pt received calm, visible, participating in milieu, and in no acute distress. Pt A & O x4. Pt denies SI, HI, A/ V H, depression, anxiety and pain at this time. A) Pt encouraged to drink fluids. Pt encouraged to come to staff with needs. Pt encouraged to attend and participate in groups. Pt encouraged to set reachable goals.  R) Pt remained safe on unit, in no acute distress, will continue to assess.     01/14/23 2300  Psych Admission Type (Psych Patients Only)  Admission Status Voluntary  Psychosocial Assessment  Patient Complaints Substance abuse;Depression  Eye Contact Fair  Facial Expression Animated  Affect Depressed  Speech Logical/coherent  Interaction Assertive  Motor Activity Other (Comment) (WNL)  Appearance/Hygiene Disheveled  Behavior Characteristics Cooperative  Mood Depressed  Thought Process  Coherency WDL  Content WDL  Delusions None reported or observed  Perception WDL  Hallucination None reported or observed  Judgment WDL  Confusion None  Danger to Self  Current suicidal ideation? Denies  Self-Injurious Behavior No self-injurious ideation or behavior indicators observed or expressed   Agreement Not to Harm Self Yes  Description of Agreement verbal  Danger to Others  Danger to Others None reported or observed

## 2023-01-14 NOTE — Progress Notes (Signed)
Per Night [facility] Fransico Michael, RN transfer to [facility] has been updated for a PENDING acceptance during 1st shift once discharges have been reviewed. Day [facility] will coordinate during 1st shift with care team.  Care Team notified: Day [facility] Danika Riley,RN,[facility] Kim Brooks,RN, [place] Davis,RN, Brooke Small,PA, Mikaela [place], [place], [place] M[place]CSWA    Maryjean Ka, MSW, [place] 01/14/2023 12:32 AM

## 2023-01-14 NOTE — ED Provider Notes (Signed)
Emergency Medicine Observation Re-evaluation Note  Seth Jordan is a 52 y.o. male, seen on rounds today.  Pt initially presented to the ED for complaints of Suicidal Currently, the patient is sleeping.  Physical Exam  BP 132/82 (BP Location: Left Arm)   Pulse 82   Temp 97.7 F (36.5 C) (Oral)   Resp 16   SpO2 100%  Physical Exam General: asleep Cardiac: asleep Lungs: asleep Psych: asleep  ED Course / MDM  EKG:EKG Interpretation Date/Time:  Tuesday January 13 2023 16:00:07 EST Ventricular Rate:  73 PR Interval:  144 QRS Duration:  122 QT Interval:  382 QTC Calculation: 420 R Axis:   -55  Text Interpretation: Normal sinus rhythm with sinus arrhythmia Right bundle branch block Left anterior fascicular block Bifascicular block Septal infarct , age undetermined Abnormal ECG When compared with ECG of 10-Jan-2023 00:01, HEART RATE has decreased Confirmed by Dione Booze ([id number]) on 01/14/2023 3:04:26 AM  I have reviewed the labs performed to date as well as medications administered while in observation.  Recent changes in the last 24 hours include being placed on CIWA protocol.  Plan  Current plan is for [facility] admission this morning.    Pricilla Loveless, MD 01/14/23 [phone]

## 2023-01-14 NOTE — BHH Group Notes (Signed)
Adult Psychoeducational Group Note  Date:  01/14/2023 Time:  10:40 PM  Group Topic/Focus:  Wrap-Up Group:   The focus of this group is to help patients review their daily goal of treatment and discuss progress on daily workbooks.  Participation Level:  Active  Participation Quality:  Attentive  Affect:  Appropriate  Cognitive:  Alert  Insight: Improving  Engagement in Group:  Engaged  Modes of Intervention:  Discussion  Additional Comments:  Pt attended and participated in NA group.  Maura Crandall [place] 01/14/2023, 10:40 PM

## 2023-01-15 DIAGNOSIS — F1020 Alcohol dependence, uncomplicated: Secondary | ICD-10-CM | POA: Insufficient documentation

## 2023-01-15 DIAGNOSIS — F10939 Alcohol use, unspecified with withdrawal, unspecified: Secondary | ICD-10-CM | POA: Insufficient documentation

## 2023-01-15 LAB — COMPREHENSIVE METABOLIC PANEL
ALT: 20 U/L (ref 0–44)
AST: 24 U/L (ref 15–41)
Albumin: 3.5 g/dL (ref 3.5–5.0)
Alkaline Phosphatase: 79 U/L (ref 38–126)
Anion gap: 8 (ref 5–15)
BUN: 18 mg/dL (ref 6–20)
CO2: 24 mmol/L (ref 22–32)
Calcium: 9 mg/dL (ref 8.9–10.3)
Chloride: 104 mmol/L (ref 98–111)
Creatinine, Ser: 1.69 mg/dL — ABNORMAL HIGH (ref 0.61–1.24)
GFR, Estimated: 48 mL/min — ABNORMAL LOW (ref >60)
Glucose, Bld: 88 mg/dL (ref 70–99)
Potassium: 4.3 mmol/L (ref 3.5–5.1)
Sodium: 136 mmol/L (ref 135–145)
Total Bilirubin: 0.2 mg/dL (ref <1.2)
Total Protein: 7.1 g/dL (ref 6.5–8.1)

## 2023-01-15 LAB — TSH: TSH: 0.921 u[IU]/mL (ref 0.350–4.500)

## 2023-01-15 LAB — FOLATE: Folate: 18.5 ng/mL (ref >5.9)

## 2023-01-15 LAB — VITAMIN B12: Vitamin B-12: 153 pg/mL — ABNORMAL LOW (ref 180–914)

## 2023-01-15 LAB — VITAMIN D 25 HYDROXY (VIT D DEFICIENCY, FRACTURES): Vit D, 25-Hydroxy: 11.68 ng/mL — ABNORMAL LOW (ref 30–100)

## 2023-01-15 MED ORDER — SERTRALINE HCL 25 MG PO TABS
25.0000 mg | ORAL_TABLET | Freq: Every day | ORAL | Status: DC
  Administered 2023-01-15 – 2023-01-17 (×3): 25 mg via ORAL
  Filled 2023-01-15 (×5): qty 1

## 2023-01-15 NOTE — Group Note (Signed)
LCSW Group Therapy Note  Group Date: 01/15/2023 Start Time: 1100 End Time: 1200   Type of Therapy and Topic:  Group Therapy - Healthy vs Unhealthy Coping Skills  Participation Level:  Minimal   Description of Group The focus of this group was to determine what unhealthy coping techniques typically are used by group members and what healthy coping techniques would be helpful in coping with various problems. Patients were guided in becoming aware of the differences between healthy and unhealthy coping techniques. Patients were asked to identify 2-3 healthy coping skills they would like to learn to use more effectively.  Therapeutic Goals Patients learned that coping is what human beings do all day long to deal with various situations in their lives Patients defined and discussed healthy vs unhealthy coping techniques Patients identified their preferred coping techniques and identified whether these were healthy or unhealthy Patients determined 2-3 healthy coping skills they would like to become more familiar with and use more often. Patients provided support and ideas to each other   Summary of Patient Progress:  Markley attended and participated most of group. Patient demonstrated positive insight into the subject matter, was respectful of peers, and participated throughout the entire session.   Therapeutic Modalities Cognitive Behavioral Therapy Motivational Interviewing  Kathi Der, LCSWA 01/15/2023  3:04 PM

## 2023-01-15 NOTE — H&P (Signed)
Psychiatric Admission Assessment Adult  Patient Identification: Seth Jordan MRN:  [id number] Date of Evaluation:  01/15/2023 Chief Complaint:  MDD (major depressive disorder) [F32.9] Principal Diagnosis: MDD (major depressive disorder) Diagnosis:  Principal Problem:   MDD (major depressive disorder)  History of Present Illness:  Patient is a 52 yo male with alcohol use disorder with polysubstance use (tobacco, amphetamines, marijuana, cocaine) and one prior suicide attempt (2021) who presents to the [facility] from the ED for worsening depression and SI with plan.  Patient reports that for the past 3 months he has had suicidal ideation with a plan to jump off of a bridge. He reports his mood is "5/10" for depression and reports that he "worries a lot". He had one suicide attempt 3 years ago in which he tried to OD on Advil. Patient is experiencing homelessness and has been staying with various friends or on the street. He endorses depressed mood and anxiety from difficulty finding housing. He reports no SI now but says if his treatment does not work he will end his life. Denies access to firearms. Endorses feelings of guilt around drinking/substance use and reports that he "can't be still". Denies sleep disturbances, anhedonia, changes in energy, concentration, or appetite. Denies hx of feelings of increased energy or mood with decreased need for sleep. Denies AVH. He reports he has never been diagnosed with any psychiatric conditions and has never taken psychotropic medications.  Patient also reports the need to detox from alcohol. He is currently drinking 1 case of beer per day for about 20 years and feels guilty about this. He has been drinking since he was very young and reports he was kicked out of 4th grade for bringing wine to school. His longest period sober was when he was in prison for 7 years for burglery around 1995. Endorses feeling irritable when not drinking alcohol but has had no other  withdrawal symptoms or seizures. Patient also endorses smoking 1 pack of cigarettes per day, one marijuana joint per week, occasionally taking Adderall, and has a 30 year history of crack use for which he reports he has been clean for 2 years.  Patient has worked at [employer] for 6 years but stopped about 4-5 months ago because a man named Ray-Ray from [place] was following him around in various cars and did not want him to have a job. Patient reports that he has talked to the police about this but the police said that no one was following him. Patient reports that he wishes he had a firearm to protect him from Ray-Ray.  Associated Signs/Symptoms:   Depression Symptoms:  depressed mood, psychomotor agitation, feelings of worthlessness/guilt, suicidal thoughts with specific plan, anxiety, (Hypo) Manic Symptoms:  Delusions, Irritable Mood, Anxiety Symptoms:  Excessive Worry, Psychotic Symptoms:  Delusions, Hallucinations: Visual Paranoia, Questionable history of seeing a man following him named Ray-Ray that does not want him to have a job PTSD Symptoms: Negative Total Time spent with patient: 30 minutes  Past Psychiatric History:  --Denies any past psychiatric diagnoses. Per chart review, previous diagnoses of schizoaffective and bipolar --Denies past psychotropic medications --Endorses hx of one suicide attempt ~3 years ago when he tried to OD on Advil. --Denies ever being in a psychiatric hospital or seeing a outpatient psychiatrist  Is the patient at risk to self? Yes.    Has the patient been a risk to self in the past 6 months? Yes.    Has the patient been a risk to self within the  distant past? Yes.    Is the patient a risk to others? No.  Has the patient been a risk to others in the past 6 months? No.  Has the patient been a risk to others within the distant past? No.   [place] Scale:  Flowsheet Row [facility] ([facility]) [facility] from [facility]13/2023 in [facility] ED from 01/09/2023 in [facility] Department at [facility]  C-SSRS RISK CATEGORY High Risk High Risk No Risk        Prior Inpatient Therapy: No.  Prior Outpatient Therapy: No.   Alcohol Screening: 1. How often do you have a drink containing alcohol?: 4 or more times a week 2. How many drinks containing alcohol do you have on a typical day when you are drinking?: 7, 8, or 9 3. How often do you have six or more drinks on one occasion?: Daily or almost daily AUDIT-C Score: 11 4. How often during the last year have you found that you were not able to stop drinking once you had started?: Daily or almost daily 5. How often during the last year have you failed to do what was normally expected from you because of drinking?: Weekly 6. How often during the last year have you needed a first drink in the morning to get yourself going after a heavy drinking session?: Weekly 7. How often during the last year have you had a feeling of guilt of remorse after drinking?: Weekly 8. How often during the last year have you been unable to remember what happened the night before because you had been drinking?: Weekly 9. Have you or someone else been injured as a result of your drinking?: No 10. Has a relative or friend or a doctor or another health worker been concerned about your drinking or suggested you cut down?: Yes, during the last year Alcohol Use Disorder Identification Test Final Score (AUDIT): 31 Substance Abuse History in the last 12 months:  Yes.   Consequences of Substance Abuse: Withdrawal Symptoms:   irritability Previous Psychotropic Medications: No  Psychological Evaluations: No  Past Medical History:  Past Medical History:  Diagnosis Date   Bipolar disease, chronic (HCC)    Schizo affective schizophrenia (HCC)    History reviewed. No pertinent surgical history. Family History: History  reviewed. No pertinent family history. Family Psychiatric  History: Denies Tobacco Screening:  Social History   Tobacco Use  Smoking Status Never   Passive exposure: Current  Smokeless Tobacco Never    BH Tobacco Counseling     Are you interested in Tobacco Cessation Medications?  No value filed. Counseled patient on smoking cessation:  No value filed. Reason Tobacco Screening Not Completed: No value filed.       Social History:  Social History   Substance and Sexual Activity  Alcohol Use Yes     Social History   Substance and Sexual Activity  Drug Use No  --Smokes 1 pack cigarettes per day --Patient reports 30 year hx of crack use but none in past 2 years. --Patient reports occasional Adderall use --Marijuana ~1 joint per week --UDS on 12/07 positive for cocaine, benzo, amphetamines   Additional Social History: Marital status: Single Does patient have children?: No       Allergies:  No Known Allergies Lab Results: No results found for this or any previous visit (from the past 48 hours).  Blood Alcohol level:  Lab  Results  Component Value Date   ETH 147 (H) 01/13/2023   ETH 20 (H) 01/09/2023    Metabolic Disorder Labs:  Lab Results  Component Value Date   HGBA1C 6.0 (H) 03/06/2013   MPG 126 (H) 03/06/2013   No results found for: "PROLACTIN" Lab Results  Component Value Date   CHOL 136 03/06/2013   TRIG 93 03/06/2013   HDL 36 (L) 03/06/2013   CHOLHDL 3.8 03/06/2013   VLDL 19 03/06/2013   LDLCALC 81 03/06/2013    Current Medications: Current Facility-Administered Medications  Medication Dose Route Frequency Provider Last Rate Last Admin   acetaminophen (TYLENOL) tablet 650 mg  650 mg Oral Q6H PRN Eligha Bridegroom, NP       alum & mag hydroxide-simeth (MAALOX/MYLANTA) 200-200-20 MG/5ML suspension 30 mL  30 mL Oral Q4H PRN Eligha Bridegroom, NP       haloperidol (HALDOL) tablet 5 mg  5 mg Oral TID PRN Eligha Bridegroom, NP       And    diphenhydrAMINE (BENADRYL) capsule 50 mg  50 mg Oral TID PRN Eligha Bridegroom, NP       haloperidol lactate (HALDOL) injection 5 mg  5 mg Intramuscular TID PRN Eligha Bridegroom, NP       And   diphenhydrAMINE (BENADRYL) injection 50 mg  50 mg Intramuscular TID PRN Eligha Bridegroom, NP       And   LORazepam (ATIVAN) injection 2 mg  2 mg Intramuscular TID PRN Eligha Bridegroom, NP       haloperidol lactate (HALDOL) injection 10 mg  10 mg Intramuscular TID PRN Eligha Bridegroom, NP       And   diphenhydrAMINE (BENADRYL) injection 50 mg  50 mg Intramuscular TID PRN Eligha Bridegroom, NP       And   LORazepam (ATIVAN) injection 2 mg  2 mg Intramuscular TID PRN Eligha Bridegroom, NP       folic acid (FOLVITE) tablet 1 mg  1 mg Oral Daily Eligha Bridegroom, NP   1 mg at 01/15/23 0757   hydrOXYzine (ATARAX) tablet 25 mg  25 mg Oral TID PRN Eligha Bridegroom, NP       hydrOXYzine (ATARAX) tablet 25 mg  25 mg Oral Q6H PRN Abbott Pao, Nadir, MD   25 mg at 01/14/23 2133   loperamide (IMODIUM) capsule 2-4 mg  2-4 mg Oral PRN Sarita Bottom, MD       LORazepam (ATIVAN) tablet 1 mg  1 mg Oral Q6H PRN Abbott Pao, Nadir, MD       magnesium hydroxide (MILK OF MAGNESIA) suspension 30 mL  30 mL Oral Daily PRN Eligha Bridegroom, NP       multivitamin with minerals tablet 1 tablet  1 tablet Oral Daily Eligha Bridegroom, NP   1 tablet at 01/15/23 0757   ondansetron (ZOFRAN-ODT) disintegrating tablet 4 mg  4 mg Oral Q6H PRN Sarita Bottom, MD       thiamine (Vitamin B-1) tablet 100 mg  100 mg Oral Daily Eligha Bridegroom, NP   100 mg at 01/15/23 0757   traZODone (DESYREL) tablet 50 mg  50 mg Oral QHS PRN Eligha Bridegroom, NP   50 mg at 01/14/23 2133   PTA Medications: Medications Prior to Admission  Medication Sig Dispense Refill Last Dose/Taking   chlordiazePOXIDE (LIBRIUM) 25 MG capsule Take 50mg  PO three times daily for 1 day, then 25-50mg  by mouth twice daily for 1 day, then 25-50mg  by mouth once daily for 1 day. (Patient not  taking: Reported on  01/13/2023) 10 capsule 0     Musculoskeletal: Strength & Muscle Tone: within normal limits Gait & Station: normal Patient leans: N/A  Psychiatric Specialty Exam:  Presentation  General Appearance:  Disheveled (stated age, poorly grromed, poor dentition)  Eye Contact: Good  Speech: Clear and Coherent  Speech Volume: Normal  Handedness: Right   Mood and Affect  Mood: Dysphoric; Depressed (5/10)  Affect: Congruent   Thought Process  Thought Processes: Linear; Goal Directed  Duration of Psychotic Symptoms:N/A Past Diagnosis of Schizophrenia or Psychoactive disorder: No data recorded Descriptions of Associations:Intact  Orientation:Full (Time, Place and Person)  Thought Content:Logical  Hallucinations:Hallucinations: None  Ideas of Reference:Paranoia  Suicidal Thoughts:Suicidal Thoughts: Yes, Passive SI Passive Intent and/or Plan: With Plan  Homicidal Thoughts:Homicidal Thoughts: No   Sensorium  Memory: Immediate Good; Recent Fair  Judgment: Fair  Insight: Fair   Executive Functions  Concentration: Good  Attention Span: Good  Recall: Good  Fund of Knowledge: Fair  Language: Good   Psychomotor Activity  Psychomotor Activity: Psychomotor Activity: Normal   Assets  Assets: Communication Skills; Desire for Improvement; Resilience   Sleep  Sleep: Sleep: Fair    Physical Exam: Physical Exam Constitutional:      Appearance: Normal appearance.  HENT:     Head: Normocephalic and atraumatic.  Pulmonary:     Effort: Pulmonary effort is normal.  Neurological:     Mental Status: He is alert and oriented to person, place, and time.    Review of Systems  Psychiatric/Behavioral:  Positive for depression and suicidal ideas. Negative for hallucinations. The patient is nervous/anxious.    Blood pressure 111/88, pulse 99, temperature 98.6 F (37 C), temperature source Oral, resp. rate 16, height 5\' 7"   (1.702 m), weight 63.6 kg, SpO2 100%. Body mass index is 21.96 kg/m.  Treatment Plan Summary: Daily contact with patient to assess and evaluate symptoms and progress in treatment and Medication management  Patient is a 52 yo male with alcohol use disorder with polysubstance use (tobacco, amphetamines, marijuana, cocaine) and one prior suicide attempt (2021) who presents to the [facility] from the ED for worsening depression and SI with plan. Differential diagnosis for patient includes substance induced mood disorder vs. Schizoaffective disorder as well as alcohol use disorder, cannabis use disorder, stimulant use disorder, and tobacco use disorder.  Safety and Monitoring: Voluntary admission to inpatient psychiatric unit for safety, stabilization and treatment Daily contact with patient to assess and evaluate symptoms and progress in treatment Patient's case to be discussed in multi-disciplinary team meeting Observation Level : q15 minute checks Vital signs: q12 hours Precautions: suicide, but pt currently verbally contracts for safety on unit   #Substance Induced Mood Disorder Patient reports depression and anxiety with current heavy alcohol use, reported hx of cocaine abuse, tobacco, marijuana, and stimulant use. UDS positive for cocaine, benzos, and amphetamines. He does not meet criteria for MDD with his current reported symptoms. Plan to start patient on Zoloft 25 mg for mood disorder and continue to encourage substance cessation. -Zoloft 25 mg  #Alcohol Use Disorder (severe) Reports irritability when withdrawing but no other symptoms. Last CIWA of 1 (anxiety). Continue CIWA with PRN Ativan. -CIWA -PRN Ativan -Thiamine -MV -Folic acid   #Tobacco Use Disorder -Nicotine patch -Nicotine gum -Encourage Cessation  #Stimulant Use Disorder #Cannabis Use Disorder Encourage cessation  PRN -Tylenol 650mg  q6h, pain -Maalox 30ml q4h, indigestion -Atarax 25mg  TID, anxiety -Milk of Mag  30mL, constipation -Trazodone 50mg  QHS, insomnia  -Imodium 2-4 mg, constipation -Zofran 4  mg q6, nausea/vomiting  Agitation protocol Haldol 5mg  TID PO PRN and Benadryl 50mg  PO TID  OR Haldol 10mg  TID IM PRN  and Benadryl 50mg  TID PRN IM and Ativan 2mg  IM TID PRN OR Haldol 5mg  IM TID PRN and Benadryl 50mg  IM TID PRN and Ativan 2mg  IM TID PRN   Discharge Planning: Social work and case management to assist with discharge planning and identification of hospital follow-up needs prior to discharge Estimated LOS: 5-7 days Discharge Concerns: Need to establish a safety plan; Medication compliance and effectiveness Discharge Goals: Return home with outpatient referrals for mental health follow-up including medication management/psychotherapy.  Observation Level/Precautions:  Elopement Detox 15 minute checks  Laboratory:  Chemistry Profile Folic Acid Vitamin B-12 Vitamin D, vitamin B1, TSH  Psychotherapy:  group therapy  Medications:  Zoloft 25 mg  Consultations:    Discharge Concerns:  rehab/housing  Estimated LOS: 5-7 days  Other:     Physician Treatment Plan for Primary Diagnosis: Substance Induced Mood Disorder Long Term Goal(s): Improvement in symptoms so as ready for discharge  Short Term Goals: Ability to identify changes in lifestyle to reduce recurrence of condition will improve, Ability to disclose and discuss suicidal ideas, Ability to identify and develop effective coping behaviors will improve, Ability to maintain clinical measurements within normal limits will improve, and Ability to identify triggers associated with substance abuse/mental health issues will improve  Physician Treatment Plan for Secondary Diagnosis: Alcohol Use Disorder  Long Term Goal(s): Improvement in symptoms so as ready for discharge  Short Term Goals: Ability to identify changes in lifestyle to reduce recurrence of condition will improve, Ability to verbalize feelings will improve, Ability to disclose  and discuss suicidal ideas, Ability to identify and develop effective coping behaviors will improve, Ability to maintain clinical measurements within normal limits will improve, and Ability to identify triggers associated with substance abuse/mental health issues will improve  I certify that inpatient services furnished can reasonably be expected to improve the patient's condition.    Barrett Shell, Medical Student 12/12/202411:01 AM

## 2023-01-15 NOTE — [facility] Suicide Risk Assessment (Signed)
Suicide Risk Assessment  Admission Assessment    [facility] Admission Suicide Risk Assessment   Nursing information obtained from:  Patient Demographic factors:  Male, Low socioeconomic status, Living alone, Unemployed Current Mental Status:  Suicidal ideation indicated by patient Loss Factors:  Financial problems / change in socioeconomic status Historical Factors:  NA Risk Reduction Factors:  NA  Total Time spent with patient: 45 minutes Principal Problem: MDD (major depressive disorder) Diagnosis:  Principal Problem:   MDD (major depressive disorder)  Subjective Data: Seth Jordan is a 52 yo male with alcohol use disorder with polysubstance use (tobacco, amphetamines, marijuana, cocaine) and one prior suicide attempt (2021) who presents to the [facility] from the ED for worsening depression and SI with plan.   On assessment this AM patient endorses SI x 3 months  that recently worsened as he feels he struggling with his Etoh use d/o and is now feeling more hopeless. Patient endorses that if he is not able to detox and give rehab a chance he will likely kill himself and endorses a plan of jumping off a bridge. Patient endorses that can feel depressed with and without Etoh and describes 7y period in prison sober, where he was more irritable. Patient denies being prescribed psychotropics at any point of his life. Patient does endorse that he would like attempt suicide if discharged but denies HI and known AVH. Patient reports that there is a man, [name] in the community who he describes as being a Higher education careers adviser with a lot of power who is after him. Patient reports that he has only ever told the police or workers at a [employer] once, about this person. Patient reports that the person is always around and sometimes he see's certain cars that he believes this man owns. Patient reports that he believes this man wanted him to lose his job he held for a few months at [employer]. Patient reports that he was even offered  the job back but he refused because he felt the man would make him lose the job again. Patient reports that the man essentially follows him and intimidates him into losing his job and that he thinks the man wants him out of [place] but is unable to give a reason why.   Continued Clinical Symptoms:  Alcohol Use Disorder Identification Test Final Score (AUDIT): 31 The "Alcohol Use Disorders Identification Test", Guidelines for Use in Primary Care, Second Edition.  World Science writer [facility]). Score between 0-7:  no or low risk or alcohol related problems. Score between 8-15:  moderate risk of alcohol related problems. Score between 16-19:  high risk of alcohol related problems. Score 20 or above:  warrants further diagnostic evaluation for alcohol dependence and treatment.   CLINICAL FACTORS:   Depression:   Hopelessness   Musculoskeletal: Strength & Muscle Tone: within normal limits Gait & Station: normal Patient leans: N/A  Psychiatric Specialty Exam:  Presentation  General Appearance:  Disheveled (stated age, poorly grromed, poor dentition)  Eye Contact: Good  Speech: Clear and Coherent  Speech Volume: Normal  Handedness: Right   Mood and Affect  Mood: Dysphoric; Depressed (5/10)  Affect: Congruent   Thought Process  Thought Processes: Linear; Goal Directed  Descriptions of Associations:Intact  Orientation:Full (Time, Place and Person)  Thought Content:Logical  History of Schizophrenia/Schizoaffective disorder:No data recorded Duration of Psychotic Symptoms:No data recorded Hallucinations:Hallucinations: None  Ideas of Reference:Paranoia  Suicidal Thoughts:Suicidal Thoughts: Yes, Passive SI Passive Intent and/or Plan: With Plan  Homicidal Thoughts:Homicidal Thoughts: No  Sensorium  Memory: Immediate Good; Recent Fair  Judgment: Fair  Insight: Fair   Executive Functions  Concentration: Good  Attention  Span: Good  Recall: [name] of Knowledge: Fair  Language: Good   Psychomotor Activity  Psychomotor Activity: Psychomotor Activity: Normal   Assets  Assets: Communication Skills; Desire for Improvement; Resilience   Sleep  Sleep: Sleep: Fair    Physical Exam: Physical Exam Constitutional:      Appearance: Normal appearance.  HENT:     Head: Normocephalic and atraumatic.  Pulmonary:     Effort: Pulmonary effort is normal.  Neurological:     Mental Status: He is alert and oriented to person, place, and time.    Review of Systems  Psychiatric/Behavioral:  Positive for depression, substance abuse and suicidal ideas. Negative for hallucinations. The patient is nervous/anxious and has insomnia.    Blood pressure 111/88, pulse 99, temperature 98.6 F (37 C), temperature source Oral, resp. rate 16, height 5\' 7"  (1.702 m), weight 63.6 kg, SpO2 100%. Body mass index is 21.96 kg/m.   COGNITIVE FEATURES THAT CONTRIBUTE TO RISK:  None    SUICIDE RISK:   Moderate:  Frequent suicidal ideation with limited intensity, and duration, some specificity in terms of plans, no associated intent, good self-control, fair dysphoria but limited symptomatology, some risk factors present, and limited identifiable protective factors, despite being homeless patient does have family he visits in the area frequently and seems to have some relationship.   PLAN OF CARE:  Patient is a 52 yo male with alcohol use disorder with polysubstance use (tobacco, amphetamines, marijuana, cocaine) and one prior suicide attempt (2021) who presents to the [facility] from the ED for worsening depression and SI with plan. Differential diagnosis for patient includes substance induced mood disorder vs. Schizoaffective disorder as well as alcohol use disorder, cannabis use disorder, stimulant use disorder, and tobacco use disorder.   Safety and Monitoring: Voluntary admission to inpatient psychiatric unit for safety,  stabilization and treatment Daily contact with patient to assess and evaluate symptoms and progress in treatment Patient's case to be discussed in multi-disciplinary team meeting Observation Level : q15 minute checks Vital signs: q12 hours Precautions: suicide, but pt currently verbally contracts for safety on unit    #Substance Induced Mood Disorder Patient reports depression and anxiety with current heavy alcohol use, reported hx of cocaine abuse, tobacco, marijuana, and stimulant use. UDS positive for cocaine, benzos, and amphetamines. He does not meet criteria for MDD with his current reported symptoms. Plan to start patient on Zoloft 25 mg for mood disorder and continue to encourage substance cessation. -Zoloft 25 mg daily, with plan for upward titration   #Alcohol Use Disorder (severe) Reports irritability when withdrawing but no other symptoms. Last CIWA of 1 (anxiety). Continue CIWA with PRN Ativan. -CIWA -PRN Ativan -Thiamine -MV     #Tobacco Use Disorder -Nicotine patch -Nicotine gum -Encourage Cessation   #Stimulant Use Disorder #Cannabis Use Disorder Encourage cessation   PRN -Tylenol 650mg  q6h, pain -Maalox 30ml q4h, indigestion -Atarax 25mg  TID, anxiety -Milk of Mag 30mL, constipation -Trazodone 50mg  QHS, insomnia    Discharge Planning: Social work and case management to assist with discharge planning and identification of hospital follow-up needs prior to discharge Estimated LOS: 5-7 days Discharge Concerns: Need to establish a safety plan; Medication compliance and effectiveness Discharge Goals: Return home with outpatient referrals for mental health follow-up including medication management/psychotherapy.    I certify that inpatient services furnished can reasonably be expected to  improve the patient's condition.   PGY-4 Bobbye Morton, MD 01/15/2023, 11:06 AM

## 2023-01-15 NOTE — Plan of Care (Signed)
  Problem: Education: Goal: Mental status will improve Outcome: Progressing   Problem: Activity: Goal: Interest or engagement in activities will improve Outcome: Progressing   Problem: Coping: Goal: Ability to verbalize frustrations and anger appropriately will improve Outcome: Progressing   Problem: Physical Regulation: Goal: Ability to maintain clinical measurements within normal limits will improve Outcome: Progressing

## 2023-01-15 NOTE — [facility] Group Notes (Signed)
[facility] Group Notes:  (Nursing/MHT/Case Management/Adjunct)  Adult Psychoeducational Group Note  Date:  01/15/2023 Time:  9:24 AM  Group Topic/Focus:  Goals Group:   The focus of this group is to help patients establish daily goals to achieve during treatment and discuss how the patient can incorporate goal setting into their daily lives to aide in recovery. Orientation:   The focus of this group is to educate the patient on the purpose and policies of crisis stabilization and provide a format to answer questions about their admission.  The group details unit policies and expectations of patients while admitted.  Participation Level:  Active  Participation Quality:  Appropriate  Affect:  Appropriate  Cognitive:  Appropriate  Insight: Appropriate  Engagement in Group:  Engaged  Modes of Intervention:  Discussion  Additional Comments:  Pt activity participated in group.   Lucilla Edin 01/15/2023, 9:24 AM

## 2023-01-15 NOTE — Hospital Course (Addendum)
Safety and Monitoring: Voluntary admission to inpatient psychiatric unit for safety, stabilization and treatment Daily contact with patient to assess and evaluate symptoms and progress in treatment Patient's case to be discussed in multi-disciplinary team meeting Observation Level : q15 minute checks Vital signs: q12 hours Precautions: suicide, but pt currently verbally contracts for safety on unit    Discharge Planning: Social work and case management to assist with discharge planning and identification of hospital follow-up needs prior to discharge Estimated LOS: 5-7 days Discharge Concerns: Need to establish a safety plan; Medication compliance and effectiveness Discharge Goals: Return home with outpatient referrals for mental health follow-up including medication management/psychotherapy.     PRN -Tylenol 650mg  q6h, pain -Maalox 30ml q4h, indigestion -Atarax 25mg  TID, anxiety -Milk of Mag 30mL, constipation -Trazodone 50mg  QHS, insomnia   Haldol 5mg  TID PO PRN and Benadryl 50mg  PO TID  OR Haldol 10mg  TID IM PRN  and Benadryl 50mg  TID PRN IM and Ativan 2mg  IM TID PRN OR Haldol 5mg  IM TID PRN and Benadryl 50mg  IM TID PRN and Ativan 2mg  IM TID PRN

## 2023-01-15 NOTE — [facility] Counselor (Signed)
Adult Comprehensive Assessment  Patient ID: Gregorey Fosse, male   DOB: 1970/09/11, 52 y.o.   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, petroleum / Lack of resources (include bankruptcy): "no" Housing / Lack of housing: "no" Physical health (include injuries & life threatening diseases): "no" Social relationships: "no" Substance abuse: "no" Bereavement / Loss: "no"  Living/Environment/Situation:  Living Arrangements: Other (Comment) Living conditions (as described by patient or guardian): Pt currently homeless and needs shelter What is atmosphere in current home: Other (Comment)  Family History:  Marital status: Single Does patient have children?: No  Childhood History:  By whom was/is the patient raised?: Mother Description of patient's relationship with caregiver when they were a child: "great" Patient's description of current relationship with people who raised him/her: "great" How were you disciplined when you got in trouble as a child/adolescent?: "good" Does patient have siblings?: Yes Number of Siblings: 2 Description of patient's current relationship with siblings: "with my sister but not my brother" Did patient suffer any verbal/emotional/physical/sexual abuse as a child?: No Did patient suffer from severe childhood neglect?: No Has patient ever been sexually abused/assaulted/raped as an adolescent or adult?: No Was the patient ever a victim of a crime or a disaster?: No Witnessed domestic violence?: No Has patient been affected by domestic violence as an adult?: No  Education:  Highest grade of school  patient has completed: "4th grade" Currently a student?: No Learning disability?: Yes What learning problems does patient have?: "math"  Employment/Work Situation:   Employment Situation: Unemployed Has Patient ever Been in the [employer]?: No  Financial Resources:   Financial resources: Medicaid  Alcohol/Substance Abuse:   What has been your use of drugs/alcohol within the last 12 months?: "beer" If attempted suicide, did drugs/alcohol play a role in this?: No Has alcohol/substance abuse ever caused legal problems?: No  Social Support System:   Describe Community Support System: Family Type of faith/religion: baptist" How does patient's faith help to cope with current illness?: "good"  Leisure/Recreation:   Do You Have Hobbies?: No  Strengths/Needs:   What is the patient's perception of their strengths?: "walking" Patient states they can use these personal strengths during their treatment to contribute to their recovery: "keeps me calm and i think alot of good things" Patient states these barriers may affect/interfere with their treatment: "no" Patient states these barriers may affect their return to the community: "no" Other important information patient would like considered in planning for their treatment: "no"  Discharge Plan:   Currently receiving community mental health services: No Patient states concerns and preferences for aftercare planning are: "just medication management" Patient states they will know when they are safe and ready for discharge when: "once i know i will have residential " Does patient have access to transportation?: No Does patient have financial barriers related to discharge medications?: No Patient description of barriers related to discharge medications: none pt has [employer] [facility] [employer] [facility] Plan for no access to transportation at discharge: CSW will coordinate Plan for living situation after discharge: the plan is to provide pt with residential  facilities for placement Will patient be returning to same living situation after discharge?: No  Summary/Recommendations:   Summary  and Recommendations (to be completed by the evaluator): 52 y.o. M who presented for depression and suicidal ideation w/ plan to jump off of a bridge. Patient stated "I want to get back on my feet and quit alcohol and find a job. I need residential treament / housing" Pt reports homelessness and has a 4th grade education. Pt denies the need for therapy but would benefit from medication management ans additional supports in the community.  Steffanie Dunn. LCSWA 01/15/2023

## 2023-01-15 NOTE — [facility] Suicide Risk Assessment (Signed)
[facility] INPATIENT:  Family/Significant Other Suicide Prevention Education  Suicide Prevention Education:  Patient Refusal for Family/Significant Other Suicide Prevention Education: The patient Seth Jordan has refused to provide written consent for family/significant other to be provided Family/Significant Other Suicide Prevention Education during admission and/or prior to discharge.  Physician notified.  Steffanie Dunn LCSWA 01/15/2023, 10:03 AM

## 2023-01-15 NOTE — Progress Notes (Signed)
   01/15/23 0900  Psych Admission Type (Psych Patients Only)  Admission Status Voluntary  Psychosocial Assessment  Patient Complaints Depression;Substance abuse  Eye Contact Fair  Facial Expression Animated;Sad  Affect Depressed  Speech Logical/coherent  Interaction Assertive  Motor Activity Other (Comment) (WNL)  Appearance/Hygiene Disheveled  Behavior Characteristics Cooperative  Mood Depressed  Thought Process  Coherency WDL  Content WDL  Delusions None reported or observed  Perception WDL  Hallucination None reported or observed  Judgment WDL  Confusion None  Danger to Self  Current suicidal ideation? Denies  Self-Injurious Behavior No self-injurious ideation or behavior indicators observed or expressed   Agreement Not to Harm Self Yes  Description of Agreement verbal  Danger to Others  Danger to Others None reported or observed

## 2023-01-15 NOTE — Group Note (Signed)
Date:  01/15/2023 Time:  5:12 AM  Group Topic/Focus:  Wrap-Up Group:   The focus of this group is to help patients review their daily goal of treatment and discuss progress on daily workbooks.    Participation Level:  Did Not Attend  Participation Quality:   N/A  Affect:   N/A  Cognitive:   N/A  Insight: None  Engagement in Group:   N/A  Modes of Intervention:   N/A  Additional Comments:  Patient did not attend wrap up group.  Kennieth Francois 01/15/2023, 5:12 AM

## 2023-01-16 ENCOUNTER — Encounter (HOSPITAL_COMMUNITY): Payer: Self-pay | Admitting: Nurse Practitioner

## 2023-01-16 MED ORDER — VITAMIN B-12 1000 MCG PO TABS
1000.0000 ug | ORAL_TABLET | Freq: Every day | ORAL | Status: DC
  Administered 2023-01-16 – 2023-01-22 (×7): 1000 ug via ORAL
  Filled 2023-01-16 (×9): qty 1

## 2023-01-16 MED ORDER — VITAMIN D3 25 MCG PO TABS
2000.0000 [IU] | ORAL_TABLET | Freq: Every day | ORAL | Status: DC
  Administered 2023-01-17 – 2023-01-22 (×6): 2000 [IU] via ORAL
  Filled 2023-01-16 (×7): qty 2

## 2023-01-16 MED ORDER — VITAMIN D (ERGOCALCIFEROL) 1.25 MG (50000 UNIT) PO CAPS
50000.0000 [IU] | ORAL_CAPSULE | ORAL | Status: DC
  Administered 2023-01-16: 50000 [IU] via ORAL
  Filled 2023-01-16 (×2): qty 1

## 2023-01-16 NOTE — [facility] Group Notes (Signed)
Adult Psychoeducational Group Note  Date:  01/16/2023 Time:  10:23 AM  Group Topic/Focus:  Goals Group:   The focus of this group is to help patients establish daily goals to achieve during treatment and discuss how the patient can incorporate goal setting into their daily lives to aide in recovery.  Participation Level:  Minimal  Participation Quality:  Appropriate and Attentive  Affect:  Appropriate  Cognitive:  Appropriate  Insight: Good  Engagement in Group:  Engaged  Modes of Intervention:  Exploration  Additional Comments:  Pt participated in group. Pt stated their goal is to be better than he was yesterday. By continuing medication. Pt did not identify or indorse signs of SI/HI    Burnett Sheng 01/16/2023, 10:23 AM

## 2023-01-16 NOTE — Progress Notes (Signed)
   01/16/23 0545  15 Minute Checks  Location Bedroom  Visual Appearance Calm  Behavior Sleeping  Sleep (Behavioral Health Patients Only)  Calculate sleep? (Click Yes once per 24 hr at 0600 safety check) Yes  Documented sleep last 24 hours 8.5

## 2023-01-16 NOTE — Progress Notes (Signed)
   01/16/23 0900  Psych Admission Type (Psych Patients Only)  Admission Status Voluntary  Psychosocial Assessment  Patient Complaints None  Eye Contact Fair  Facial Expression Animated  Affect Appropriate to circumstance  Speech Logical/coherent  Interaction Assertive  Motor Activity Other (Comment) (WDL)  Appearance/Hygiene Unremarkable  Behavior Characteristics Cooperative;Appropriate to situation  Mood Pleasant  Thought Process  Coherency WDL  Content WDL  Delusions None reported or observed  Perception WDL  Hallucination None reported or observed  Judgment WDL  Confusion None  Danger to Self  Current suicidal ideation? Denies  Agreement Not to Harm Self Yes  Description of Agreement verbal  Danger to Others  Danger to Others None reported or observed   Patient alert and oriented. Patient denies SI, HI, AVH, and pain. Scheduled medications administered to patient, per provider orders. Support and encouragement provided. Routine safety checks conducted every 15 minutes. Patient verbally contracts for safety and remains safe on the unit.

## 2023-01-16 NOTE — Progress Notes (Signed)
[facility] MD Progress Note  01/16/2023 1:54 PM Seth Jordan  MRN:  [id number] Principal Problem: Substance induced mood disorder (HCC) Diagnosis: Principal Problem:   Substance induced mood disorder (HCC) Active Problems:   Right bundle branch block (RBBB) on electrocardiogram (ECG)   Alcohol use disorder, severe, dependence (HCC)   Alcohol withdrawal (HCC)   ID & Admission Information: Seth Jordan is an 52 y.o. male who  has a past medical history of B12 deficiency, Bipolar disease, chronic (HCC), Schizo affective schizophrenia (HCC), and Vitamin D deficiency.  He presented on 01/14/2023  2:17 PM for Substance induced mood disorder (HCC).   Chief Complaint: "That medicine helps!"  Subjective:   Case was discussed in the multidisciplinary team. MAR was reviewed and patient was compliant with medications.  No acute events occurred overnight.   Seth Jordan was seen in his room during rounds.  He reported that his mood was "good" today.  He denies any new psychiatric or medical complaints.  He reports that his appetite is good.  He reports that focus and concentration are adequate.  He denies issues with energy.  He reports adequate sleep.  He denies any medication side effects.  He denies suicidal ideations, homicidal ideations, auditory hallucinations, visual hallucinations, or delusions. He is hopeful for the future as he is discussed getting into a long-term substance abuse treatment program with social work.  We discussed vitamin deficiencies and subsequent treatment plan.  We also discussed increasing sertraline.   Past Psychiatric and Medical Medical History:  Past Medical History:  Diagnosis Date   B12 deficiency    Bipolar disease, chronic (HCC)    Schizo affective schizophrenia (HCC)    Vitamin D deficiency     History reviewed. No pertinent surgical history.  --Denies any past psychiatric diagnoses. Per chart review, previous diagnoses of schizoaffective and bipolar --Denies past  psychotropic medications --Endorses hx of one suicide attempt ~3 years ago when he tried to OD on Advil. --Denies ever being in a psychiatric hospital or seeing a outpatient psychiatrist  Family History(Medical and Psychiatric): History reviewed. No pertinent family history. Denies    Social History:  Social History   Substance and Sexual Activity  Alcohol Use Yes     Social History   Substance and Sexual Activity  Drug Use No    Social History   Socioeconomic History   Marital status: Single    Spouse name: Not on file   Number of children: Not on file   Years of education: Not on file   Highest education level: Not on file  Occupational History   Not on file  Tobacco Use   Smoking status: Never    Passive exposure: Current   Smokeless tobacco: Never  Vaping Use   Vaping status: Never Used  Substance and Sexual Activity   Alcohol use: Yes   Drug use: No   Sexual activity: Not Currently  Other Topics Concern   Not on file  Social History Narrative   Not on file   Social Drivers of Health   Financial Resource Strain: Not on file  Food Insecurity: Food Insecurity Present (01/14/2023)   Hunger Vital Sign    Worried About Running Out of Food in the Last Year: Sometimes true    Ran Out of Food in the Last Year: Sometimes true  Transportation Needs: Unmet Transportation Needs (01/14/2023)   PRAPARE - Administrator, Civil Service (Medical): Yes    Lack of Transportation (Non-Medical): Yes  Physical Activity: Not  on file  Stress: Not on file  Social Connections: Unknown (11/12/2022)   Received from [facility]   Social Network    Social Network: Not on file        Current Medications: Current Facility-Administered Medications  Medication Dose Route Frequency Provider Last Rate Last Admin   acetaminophen (TYLENOL) tablet 650 mg  650 mg Oral Q6H PRN Eligha Bridegroom, NP       alum & mag hydroxide-simeth (MAALOX/MYLANTA) 200-200-20 MG/5ML  suspension 30 mL  30 mL Oral Q4H PRN Eligha Bridegroom, NP       cyanocobalamin (VITAMIN B12) tablet 1,000 mcg  1,000 mcg Oral Daily Golda Acre, MD   1,000 mcg at 01/16/23 1054   haloperidol (HALDOL) tablet 5 mg  5 mg Oral TID PRN Eligha Bridegroom, NP       And   diphenhydrAMINE (BENADRYL) capsule 50 mg  50 mg Oral TID PRN Eligha Bridegroom, NP       haloperidol lactate (HALDOL) injection 5 mg  5 mg Intramuscular TID PRN Eligha Bridegroom, NP       And   diphenhydrAMINE (BENADRYL) injection 50 mg  50 mg Intramuscular TID PRN Eligha Bridegroom, NP       And   LORazepam (ATIVAN) injection 2 mg  2 mg Intramuscular TID PRN Eligha Bridegroom, NP       haloperidol lactate (HALDOL) injection 10 mg  10 mg Intramuscular TID PRN Eligha Bridegroom, NP       And   diphenhydrAMINE (BENADRYL) injection 50 mg  50 mg Intramuscular TID PRN Eligha Bridegroom, NP       And   LORazepam (ATIVAN) injection 2 mg  2 mg Intramuscular TID PRN Eligha Bridegroom, NP       folic acid (FOLVITE) tablet 1 mg  1 mg Oral Daily Eligha Bridegroom, NP   1 mg at 01/16/23 6269   hydrOXYzine (ATARAX) tablet 25 mg  25 mg Oral TID PRN Eligha Bridegroom, NP       hydrOXYzine (ATARAX) tablet 25 mg  25 mg Oral Q6H PRN Abbott Pao, Nadir, MD   25 mg at 01/15/23 2115   loperamide (IMODIUM) capsule 2-4 mg  2-4 mg Oral PRN Sarita Bottom, MD       LORazepam (ATIVAN) tablet 1 mg  1 mg Oral Q6H PRN Abbott Pao, Nadir, MD       magnesium hydroxide (MILK OF MAGNESIA) suspension 30 mL  30 mL Oral Daily PRN Eligha Bridegroom, NP       multivitamin with minerals tablet 1 tablet  1 tablet Oral Daily Eligha Bridegroom, NP   1 tablet at 01/16/23 0851   ondansetron (ZOFRAN-ODT) disintegrating tablet 4 mg  4 mg Oral Q6H PRN Abbott Pao, Nadir, MD       sertraline (ZOLOFT) tablet 25 mg  25 mg Oral Daily Eliseo Gum B, MD   25 mg at 01/16/23 0851   thiamine (Vitamin B-1) tablet 100 mg  100 mg Oral Daily Eligha Bridegroom, NP   100 mg at 01/16/23 0851   traZODone (DESYREL)  tablet 50 mg  50 mg Oral QHS PRN Eligha Bridegroom, NP   50 mg at 01/15/23 2115   Vitamin D (Ergocalciferol) (DRISDOL) 1.25 MG (50000 UNIT) capsule 50,000 Units  50,000 Units Oral Q7 days Golda Acre, MD   50,000 Units at 01/16/23 1054   [START ON 01/17/2023] vitamin D3 (CHOLECALCIFEROL) tablet 2,000 Units  2,000 Units Oral Daily Golda Acre, MD        Lab Results:  Results for orders placed or performed during the hospital encounter of 01/14/23 (from the past 48 hours)  Comprehensive metabolic panel     Status: Abnormal   Collection Time: 01/15/23  7:09 PM  Result Value Ref Range   Sodium 136 135 - 145 mmol/L   Potassium 4.3 3.5 - 5.1 mmol/L   Chloride 104 98 - 111 mmol/L   CO2 24 22 - 32 mmol/L   Glucose, Bld 88 70 - 99 mg/dL    Comment: Glucose reference range applies only to samples taken after fasting for at least 8 hours.   BUN 18 6 - 20 mg/dL   Creatinine, Ser 4.09 (H) 0.61 - 1.24 mg/dL   Calcium 9.0 8.9 - 81.1 mg/dL   Total Protein 7.1 6.5 - 8.1 g/dL   Albumin 3.5 3.5 - 5.0 g/dL   AST 24 15 - 41 U/L   ALT 20 0 - 44 U/L   Alkaline Phosphatase 79 38 - 126 U/L   Total Bilirubin 0.2 <1.2 mg/dL   GFR, Estimated 48 (L) >60 mL/min    Comment: (NOTE) Calculated using the CKD-EPI Creatinine Equation (2021)    Anion gap 8 5 - 15    Comment: Performed at [facility], [address], [place], [place] [address]  Folate     Status: None   Collection Time: 01/15/23  7:09 PM  Result Value Ref Range   Folate 18.5 >5.9 ng/mL    Comment: Performed at [facility], [address] [place], [place] [address]  Vitamin B12     Status: Abnormal   Collection Time: 01/15/23  7:09 PM  Result Value Ref Range   Vitamin B-12 153 (L) 180 - 914 pg/mL    Comment: (NOTE) This assay is not validated for testing neonatal or myeloproliferative syndrome specimens for Vitamin B12 levels. Performed at [facility], [address] [place], [place] [address]   VITAMIN D 25 Hydroxy (Vit-D Deficiency, Fractures)     Status: Abnormal   Collection Time: 01/15/23  7:09 PM  Result Value Ref Range   Vit D, 25-Hydroxy 11.68 (L) 30 - 100 ng/mL    Comment: (NOTE) Vitamin D deficiency has been defined by the Institute of Medicine  and an Endocrine Society practice guideline as a level of serum 25-OH  vitamin D less than 20 ng/mL (1,2). The Endocrine Society went on to  further define vitamin D insufficiency as a level between 21 and 29  ng/mL (2).  1. IOM (Institute of Medicine). 2010. Dietary reference intakes for  calcium and D. [place]: [employer]. 2. Holick MF, Binkley Wimer, Bischoff-Ferrari HA, et al. Evaluation,  treatment, and prevention of vitamin D deficiency: an Endocrine  Society clinical practice guideline, JCEM. 2011 Jul; 96(7): 1911-30.  Performed at [facility], [address], [place], [place] [address]   TSH     Status: None   Collection Time: 01/15/23  7:09 PM  Result Value Ref Range   TSH 0.921 0.350 - 4.500 uIU/mL    Comment: Performed by a 3rd Generation assay with a functional sensitivity of <=0.01 uIU/mL. Performed at [facility], [address] [place], [place] [address]     Blood Alcohol level:  Lab Results  Component Value Date   ETH 147 (H) 01/13/2023   ETH 20 (H) 01/09/2023    Metabolic Disorder Labs: Lab Results  Component Value Date   HGBA1C 6.0 (H) 03/06/2013  MPG 126 (H) 03/06/2013   No results found for: "PROLACTIN" Lab Results  Component Value Date   CHOL 136 03/06/2013   TRIG 93 03/06/2013   HDL 36 (L) 03/06/2013   CHOLHDL 3.8 03/06/2013   VLDL 19 03/06/2013   LDLCALC 81 03/06/2013    Physical Findings: AIMS:  , ,  ,  ,    CIWA:  CIWA-Ar Total: 0 COWS:     Psychiatric Specialty Exam:  Presentation  General Appearance: Disheveled  Eye Contact: Fair  Speech: Clear and Coherent  Speech Volume:  Normal  Handedness: Right   Mood and Affect  Mood: Depressed; Anxious  Affect: Restricted; Congruent   Thought Process  Thought Processes: Linear  Descriptions of Associations: Intact  Orientation: Full (Time, Place and Person)  Thought Content: Logical  History of Schizophrenia/Schizoaffective disorder: No data recorded Duration of Psychotic Symptoms: NA Hallucinations: Hallucinations: Auditory  Ideas of Reference: None  Suicidal Thoughts: Suicidal Thoughts: Yes, Passive SI Passive Intent and/or Plan: Without Intent  Homicidal Thoughts: Homicidal Thoughts: No   Sensorium  Memory: Immediate Good; Recent Good  Judgment: Poor  Insight: Fair   Chartered certified accountant: Fair  Attention Span: Fair  Recall: Good  Fund of Knowledge: Good  Language: Good   Psychomotor Activity  Psychomotor Activity: Psychomotor Activity: Normal   Assets  Assets: Communication Skills; Leisure Time   Sleep  Sleep: Sleep: Good Number of Hours of Sleep: 8.25   Musculoskeletal: Strength & Muscle Tone: within normal limits Gait & Station: normal Patient leans: N/A   Physical Exam: General: Sitting comfortably. NAD. HEENT: Normocephalic, atraumatic, MMM, EMOI Lungs: no increased work of breathing noted Heart: no cyanosis Abdomen: Non distended Musculoskeletal: FROM. No obvious deformities Skin: Warm, dry, intact. No rashes noted Neuro: No obvious focal deficits.  Gait and station are normal  Review of Systems  Constitutional: Negative.   HENT: Negative.    Eyes: Negative.   Respiratory: Negative.    Cardiovascular: Negative.   Gastrointestinal: Negative.   Genitourinary: Negative.   Skin: Negative.   Neurological: Negative.      Blood pressure 124/83, pulse 83, temperature 98.5 F (36.9 C), temperature source Oral, resp. rate 18, height 5\' 7"  (1.702 m), weight 63.6 kg, SpO2 100%. Body mass index is 21.96 kg/m.  ASSESSMENT: Seth Jordan is an  52 y.o. male who  has a past medical history of B12 deficiency, Bipolar disease, chronic (HCC), Schizo affective schizophrenia (HCC), and Vitamin D deficiency.  He presented on 01/14/2023  2:17 PM for Substance induced mood disorder (HCC).    Diagnoses / Active Problems: Patient Active Problem List   Diagnosis Date Noted   Alcohol use disorder, severe, dependence (HCC) 01/15/2023   Alcohol withdrawal (HCC) 01/15/2023   Substance induced mood disorder (HCC) 01/14/2023   Suicidal ideations 01/13/2023   MDD (major depressive disorder), single episode, severe (HCC) 01/13/2023   Chest pain 03/14/2013   Right bundle branch block (RBBB) on electrocardiogram (ECG) 03/06/2013   Musculoskeletal chest pain 03/05/2013      PLAN: Safety and Monitoring:  -- Voluntary admission to inpatient psychiatric unit for safety, stabilization and treatment  -- Daily contact with patient to assess and evaluate symptoms and progress in treatment  -- Patient's case to be discussed in multi-disciplinary team meeting  -- Observation Level : q15 minute checks  -- Vital signs:  q12 hours  -- Precautions: suicide, elopement, and assault  2. Psychiatric Diagnoses and Treatment:   --  Patient Active Problem List  Diagnosis Date Noted   Alcohol use disorder, severe, dependence (HCC) 01/15/2023   Alcohol withdrawal (HCC) 01/15/2023   Substance induced mood disorder (HCC) 01/14/2023   Suicidal ideations 01/13/2023   MDD (major depressive disorder), single episode, severe (HCC) 01/13/2023   Chest pain 03/14/2013   Right bundle branch block (RBBB) on electrocardiogram (ECG) 03/06/2013   Musculoskeletal chest pain 03/05/2013    Increase sertraline to 50 mg daily Start B12 for B12 deficiency Start vitamin D for vitamin D deficiency   3. Medical Issues Being Addressed:   -- NA  Labs reviewed, unremarkable with the exception of: Vitamin D deficiency of 11.68, B12 deficiency of 153     4. Discharge Planning:    -- Social work and case management to assist with discharge planning and identification of hospital follow-up needs prior to discharge  -- Estimated LOS: 5 days  -- Discharge Concerns: Need to establish a safety plan; Medication compliance and effectiveness  -- Discharge Goals: Return home with outpatient referrals for mental health follow-up including medication management/psychotherapy  5. Short Term Goals:  Improve ability to identify changes in lifestyle to reduce recurrence of condition, verbalize feelings, disclose and discuss suicidal ideas, demonstrate self-control, identify and develop effective coping behaviors, compliance with prescribed medications, identify triggers associated with substance abuse/mental health issues, participate in unit milieu and in scheduled group therapies   6. Long Term Goals: Improvement in symptoms so the patient is ready for discharge   --The risks/benefits/side-effects/alternatives to the medications above were discussed in detail with the patient and time was given for questions. The patient provided informed consent.   -- Metabolic profile and EKG monitoring obtained while on an atypical antipsychotic and listed in the EHR    Total Time Spent in Direct Patient Care:  I personally spent 35 minutes on the unit in direct patient care. The direct patient care time included face-to-face time with the patient, reviewing the patient's chart, communicating with other professionals, and coordinating care. Greater than 50% of this time was spent in counseling or coordinating care with the patient regarding goals of hospitalization, psycho-education, and discharge planning needs.      Criss Alvine, MD Psychiatrist  01/16/2023, 1:54 PM   I certify that inpatient services furnished can reasonably be expected to improve the patient's condition.    Portions of this note were created using voice recognition software. Minor syntax errors, grammatical content,  spelling, or punctuation errors may have occurred unintentionally. Please notify the [facility] Parkin if the meaning of any statement is unclear.

## 2023-01-16 NOTE — Progress Notes (Signed)
   01/16/23 2120  Psych Admission Type (Psych Patients Only)  Admission Status Voluntary  Psychosocial Assessment  Patient Complaints Anxiety  Eye Contact Fair  Facial Expression Animated;Sad  Affect Depressed  Speech Logical/coherent  Interaction Assertive  Motor Activity Other (Comment) (WDL)  Appearance/Hygiene Disheveled  Behavior Characteristics Cooperative;Appropriate to situation  Mood Pleasant;Anxious  Thought Process  Coherency WDL  Content WDL  Delusions None reported or observed  Perception WDL  Hallucination None reported or observed  Judgment WDL  Confusion None  Danger to Self  Current suicidal ideation? Denies  Danger to Others  Danger to Others None reported or observed

## 2023-01-16 NOTE — Plan of Care (Signed)
   Problem: Education: Goal: Emotional status will improve Outcome: Progressing Goal: Mental status will improve Outcome: Progressing   Problem: Coping: Goal: Ability to demonstrate self-control will improve Outcome: Progressing   

## 2023-01-16 NOTE — Group Note (Signed)
Recreation Therapy Group Note   Group Topic:Stress Management  Group Date: 01/16/2023 Start Time: 0947 End Time: 1009 Facilitators: Jolynne Spurgin-McCall, LRT,CTRS Location: [address] Dayroom   Group Topic: Stress Management   Goal Area(s) Addresses:  Patient will actively participate in stress management techniques presented during session.  Patient will successfully identify benefit of practicing stress management post d/c.   Behavioral Response: Appropriate  Intervention: Relaxation exercise with ambient sound and script   Group Description: Guided Imagery. LRT provided education, instruction, and demonstration on practice of visualization via guided imagery. Patient was asked to participate in the technique introduced during session. LRT debriefed including topics of mindfulness, stress management and specific scenarios each patient could use these techniques. Patients were given suggestions of ways to access scripts post d/c and encouraged to explore Youtube and other apps available on smartphones, tablets, and computers.  Education: Stress Management, Discharge Planning.   Education Outcome: Acknowledges education   Affect/Mood: Appropriate   Participation Level: Engaged   Participation Quality: Independent   Behavior: Appropriate   Speech/Thought Process: Focused   Insight: Good   Judgement: Good   Modes of Intervention: Script   Patient Response to Interventions:  Engaged   Education Outcome:  In group clarification offered    Clinical Observations/Individualized Feedback: Patient actively engaged in technique introduced, expressed no concerns and demonstrated ability to practice skill independently post d/c.      Plan: Continue to engage patient in RT group sessions 2-3x/week.   Shaquira Moroz-McCall, LRT,CTRS  01/16/2023 12:01 PM

## 2023-01-16 NOTE — [facility] Group Notes (Signed)
Adult Psychoeducational Group Note  Date:  01/16/2023 Time:  10:26 PM  Group Topic/Focus:  Wrap-Up Group:   The focus of this group is to help patients review their daily goal of treatment and discuss progress on daily workbooks.  Participation Level:  Active  Participation Quality:  Attentive  Affect:  Appropriate  Cognitive:  Alert  Insight: Improving  Engagement in Group:  Engaged  Modes of Intervention:  Discussion  Additional Comments:  Pt attended and participated in AA group.  Maura Crandall Cassandra 01/16/2023, 10:26 PM

## 2023-01-16 NOTE — Plan of Care (Signed)
  Problem: Education: Goal: Emotional status will improve Outcome: Progressing   Problem: Activity: Goal: Interest or engagement in activities will improve Outcome: Progressing   

## 2023-01-16 NOTE — Progress Notes (Signed)
   01/15/23 2115  Psych Admission Type (Psych Patients Only)  Admission Status Voluntary  Psychosocial Assessment  Patient Complaints Depression;Substance abuse  Eye Contact Fair  Facial Expression Animated;Sad  Affect Depressed  Speech Logical/coherent  Interaction Assertive  Motor Activity Other (Comment) (WDL)  Appearance/Hygiene Disheveled  Behavior Characteristics Cooperative;Appropriate to situation  Mood Depressed  Thought Process  Coherency WDL  Content WDL  Delusions None reported or observed  Perception WDL  Hallucination None reported or observed  Judgment WDL  Confusion None  Danger to Self  Current suicidal ideation? Denies  Danger to Others  Danger to Others None reported or observed    Patient presents with moderate anxiety and depression.  Patient denies SI/HI/AVH and pain.  Patient reports he would like medication for sleep.  Patient reports he desires a long term detox program. He states, "alcohol has been my downfall, I have been going in circles, and I am tired."  Patient was provided reassurance.  Administered PRN Hydroxyzine and Trazodone per [facility] per patient request.  Patient is safe on the unit with q15 minute safety checks.

## 2023-01-16 NOTE — Plan of Care (Signed)

## 2023-01-17 MED ORDER — SERTRALINE HCL 50 MG PO TABS
50.0000 mg | ORAL_TABLET | Freq: Every day | ORAL | Status: DC
Start: 2023-01-18 — End: 2023-01-19
  Administered 2023-01-18 – 2023-01-19 (×2): 50 mg via ORAL
  Filled 2023-01-17 (×3): qty 1

## 2023-01-17 NOTE — [facility] Group Notes (Signed)
Pt did not attend in the group activity

## 2023-01-17 NOTE — Plan of Care (Signed)

## 2023-01-17 NOTE — [facility] Group Notes (Signed)
[facility] Group Notes:  (Nursing/MHT/Case Management/Adjunct)  Date:  01/17/2023  Time:  2015  Type of Therapy:   Wrap up group  Participation Level:  Active  Participation Quality:  Appropriate, Attentive, Sharing, and Supportive  Affect:  Appropriate  Cognitive:  Alert  Insight:  Improving  Engagement in Group:  Engaged  Modes of Intervention:  Clarification, Education, and Support  Summary of Progress/Problems: Positive thinking and positive change were discussed.   Marcille Buffy 01/17/2023, 8:55 PM

## 2023-01-17 NOTE — Progress Notes (Signed)
   01/17/23 0555  15 Minute Checks  Location Bedroom  Visual Appearance Calm  Behavior Sleeping  Sleep (Behavioral Health Patients Only)  Calculate sleep? (Click Yes once per 24 hr at 0600 safety check) Yes  Documented sleep last 24 hours 8.5

## 2023-01-17 NOTE — Progress Notes (Signed)
   01/17/23 2212  Psych Admission Type (Psych Patients Only)  Admission Status Voluntary  Psychosocial Assessment  Patient Complaints Anxiety  Eye Contact Fair  Facial Expression Animated  Affect Appropriate to circumstance  Speech Logical/coherent  Interaction Assertive  Motor Activity Other (Comment) (WDL)  Appearance/Hygiene Unremarkable  Behavior Characteristics Appropriate to situation  Mood Pleasant  Thought Process  Coherency WDL  Content WDL  Delusions None reported or observed  Perception WDL  Hallucination None reported or observed  Judgment WDL  Confusion None  Danger to Self  Current suicidal ideation? Denies  Self-Injurious Behavior No self-injurious ideation or behavior indicators observed or expressed   Agreement Not to Harm Self Yes  Description of Agreement verbal  Danger to Others  Danger to Others None reported or observed

## 2023-01-17 NOTE — Progress Notes (Signed)
Pt did not attend goals group. 

## 2023-01-17 NOTE — [facility] Group Notes (Signed)
[facility] Group Notes:  (Nursing)  Date:  01/17/2023  Time:  1400  Type of Therapy:  Psychoeducational Skills  Participation Level:  Active  Participation Quality:  Appropriate and Attentive  Affect:  Flat  Cognitive:  Appropriate  Insight:  Improving  Engagement in Group:  Improving and Lacking  Modes of Intervention:  Activity, Discussion, Exploration, Rapport Building, Socialization, and Support  Summary of Progress/Problems:  Seth Jordan 01/17/2023, 3:43 PM

## 2023-01-17 NOTE — Progress Notes (Signed)
[facility] MD Progress Note  01/17/2023 11:24 AM Al Lullo  MRN:  [id number] Principal Problem: Substance induced mood disorder (HCC) Diagnosis: Principal Problem:   Substance induced mood disorder (HCC) Active Problems:   Right bundle branch block (RBBB) on electrocardiogram (ECG)   Alcohol use disorder, severe, dependence (HCC)   Alcohol withdrawal (HCC)   ID & Admission Information: Seth Jordan is an 52 y.o. male who  has a past medical history of B12 deficiency, Bipolar disease, chronic (HCC), Schizo affective schizophrenia (HCC), and Vitamin D deficiency.  He presented on 01/14/2023  2:17 PM for Substance induced mood disorder (HCC).   Chief Complaint: "I'm feeling a little better"  Subjective:   Case was discussed in the multidisciplinary team. MAR was reviewed and patient was compliant with medications.  No acute events occurred overnight.  Seth Jordan was seen in his room during rounds.  He continues to report improvement in his mood which she attributes to Zoloft.  He reports that he is feeling more hopeful related to getting treatment for substance abuse.  He has expressed interest in particular and going to [facility].  He denies suicidal or homicidal ideation today.  He denies auditory or visual hallucinations.  He had a CIWA of 0 this morning.  He has been participating in groups, and is present in the milieu.  We discussed his vitamin deficiencies and the treatment plan for correcting the deficiencies.  We also discussed the plan to increase Zoloft to 50 mg.  All questions were answered to the patient's satisfaction, and he agrees to all elements of the treatment plan.    Past Psychiatric and Medical Medical History:  Past Medical History:  Diagnosis Date   B12 deficiency    Bipolar disease, chronic (HCC)    Schizo affective schizophrenia (HCC)    Vitamin D deficiency     History reviewed. No pertinent surgical history.  --Denies any past psychiatric diagnoses. Per chart review,  previous diagnoses of schizoaffective and bipolar --Denies past psychotropic medications --Endorses hx of one suicide attempt ~3 years ago when he tried to OD on Advil. --Denies ever being in a psychiatric hospital or seeing a outpatient psychiatrist  Family History(Medical and Psychiatric): History reviewed. No pertinent family history. Denies    Social History:  Social History   Substance and Sexual Activity  Alcohol Use Yes     Social History   Substance and Sexual Activity  Drug Use No    Social History   Socioeconomic History   Marital status: Single    Spouse name: Not on file   Number of children: Not on file   Years of education: Not on file   Highest education level: Not on file  Occupational History   Not on file  Tobacco Use   Smoking status: Never    Passive exposure: Current   Smokeless tobacco: Never  Vaping Use   Vaping status: Never Used  Substance and Sexual Activity   Alcohol use: Yes   Drug use: No   Sexual activity: Not Currently  Other Topics Concern   Not on file  Social History Narrative   Not on file   Social Drivers of Health   Financial Resource Strain: Not on file  Food Insecurity: Food Insecurity Present (01/14/2023)   Hunger Vital Sign    Worried About Running Out of Food in the Last Year: Sometimes true    Ran Out of Food in the Last Year: Sometimes true  Transportation Needs: Unmet Transportation Needs (01/14/2023)  PRAPARE - Administrator, Civil Service (Medical): Yes    Lack of Transportation (Non-Medical): Yes  Physical Activity: Not on file  Stress: Not on file  Social Connections: Unknown (11/12/2022)   [facility] from [facility]   Social Network    Social Network: Not on file        Current Medications: Current Facility-Administered Medications  Medication Dose Route Frequency Provider Last Rate Last Admin   acetaminophen (TYLENOL) tablet 650 mg  650 mg Oral Q6H PRN Eligha Bridegroom, NP        alum & mag hydroxide-simeth (MAALOX/MYLANTA) 200-200-20 MG/5ML suspension 30 mL  30 mL Oral Q4H PRN Eligha Bridegroom, NP       cyanocobalamin (VITAMIN B12) tablet 1,000 mcg  1,000 mcg Oral Daily Golda Acre, MD   1,000 mcg at 01/17/23 1610   haloperidol (HALDOL) tablet 5 mg  5 mg Oral TID PRN Eligha Bridegroom, NP       And   diphenhydrAMINE (BENADRYL) capsule 50 mg  50 mg Oral TID PRN Eligha Bridegroom, NP       haloperidol lactate (HALDOL) injection 5 mg  5 mg Intramuscular TID PRN Eligha Bridegroom, NP       And   diphenhydrAMINE (BENADRYL) injection 50 mg  50 mg Intramuscular TID PRN Eligha Bridegroom, NP       And   LORazepam (ATIVAN) injection 2 mg  2 mg Intramuscular TID PRN Eligha Bridegroom, NP       haloperidol lactate (HALDOL) injection 10 mg  10 mg Intramuscular TID PRN Eligha Bridegroom, NP       And   diphenhydrAMINE (BENADRYL) injection 50 mg  50 mg Intramuscular TID PRN Eligha Bridegroom, NP       And   LORazepam (ATIVAN) injection 2 mg  2 mg Intramuscular TID PRN Eligha Bridegroom, NP       folic acid (FOLVITE) tablet 1 mg  1 mg Oral Daily Eligha Bridegroom, NP   1 mg at 01/17/23 9604   hydrOXYzine (ATARAX) tablet 25 mg  25 mg Oral TID PRN Eligha Bridegroom, NP       hydrOXYzine (ATARAX) tablet 25 mg  25 mg Oral Q6H PRN Abbott Pao, Nadir, MD   25 mg at 01/16/23 2119   loperamide (IMODIUM) capsule 2-4 mg  2-4 mg Oral PRN Sarita Bottom, MD       LORazepam (ATIVAN) tablet 1 mg  1 mg Oral Q6H PRN Abbott Pao, Nadir, MD       magnesium hydroxide (MILK OF MAGNESIA) suspension 30 mL  30 mL Oral Daily PRN Eligha Bridegroom, NP       multivitamin with minerals tablet 1 tablet  1 tablet Oral Daily Eligha Bridegroom, NP   1 tablet at 01/17/23 0912   ondansetron (ZOFRAN-ODT) disintegrating tablet 4 mg  4 mg Oral Q6H PRN Sarita Bottom, MD       [START ON 01/18/2023] sertraline (ZOLOFT) tablet 50 mg  50 mg Oral Daily Golda Acre, MD       thiamine (Vitamin B-1) tablet 100 mg  100 mg Oral Daily Eligha Bridegroom, NP   100 mg at 01/17/23 0912   traZODone (DESYREL) tablet 50 mg  50 mg Oral QHS PRN Eligha Bridegroom, NP   50 mg at 01/16/23 2120   Vitamin D (Ergocalciferol) (DRISDOL) 1.25 MG (50000 UNIT) capsule 50,000 Units  50,000 Units Oral Q7 days Golda Acre, MD   50,000 Units at 01/16/23 1054   vitamin D3 (CHOLECALCIFEROL) tablet  2,000 Units  2,000 Units Oral Daily Golda Acre, MD   2,000 Units at 01/17/23 0912    Lab Results:  Results for orders placed or performed during the hospital encounter of 01/14/23 (from the past 48 hours)  Comprehensive metabolic panel     Status: Abnormal   Collection Time: 01/15/23  7:09 PM  Result Value Ref Range   Sodium 136 135 - 145 mmol/L   Potassium 4.3 3.5 - 5.1 mmol/L   Chloride 104 98 - 111 mmol/L   CO2 24 22 - 32 mmol/L   Glucose, Bld 88 70 - 99 mg/dL    Comment: Glucose reference range applies only to samples taken after fasting for at least 8 hours.   BUN 18 6 - 20 mg/dL   Creatinine, Ser 1.61 (H) 0.61 - 1.24 mg/dL   Calcium 9.0 8.9 - 09.6 mg/dL   Total Protein 7.1 6.5 - 8.1 g/dL   Albumin 3.5 3.5 - 5.0 g/dL   AST 24 15 - 41 U/L   ALT 20 0 - 44 U/L   Alkaline Phosphatase 79 38 - 126 U/L   Total Bilirubin 0.2 <1.2 mg/dL   GFR, Estimated 48 (L) >60 mL/min    Comment: (NOTE) Calculated using the CKD-EPI Creatinine Equation (2021)    Anion gap 8 5 - 15    Comment: Performed at [facility], [address] [place], [place] [address]  Folate     Status: None   Collection Time: 01/15/23  7:09 PM  Result Value Ref Range   Folate 18.5 >5.9 ng/mL    Comment: Performed at [facility], [address] [place], [place] [address]  Vitamin B12     Status: Abnormal   Collection Time: 01/15/23  7:09 PM  Result Value Ref Range   Vitamin B-12 153 (L) 180 - 914 pg/mL    Comment: (NOTE) This assay is not validated for testing neonatal or myeloproliferative syndrome specimens for Vitamin B12  levels. Performed at [facility], [address], [place], [place] [address]   VITAMIN D 25 Hydroxy (Vit-D Deficiency, Fractures)     Status: Abnormal   Collection Time: 01/15/23  7:09 PM  Result Value Ref Range   Vit D, 25-Hydroxy 11.68 (L) 30 - 100 ng/mL    Comment: (NOTE) Vitamin D deficiency has been defined by the Institute of Medicine  and an Endocrine Society practice guideline as a level of serum 25-OH  vitamin D less than 20 ng/mL (1,2). The Endocrine Society went on to  further define vitamin D insufficiency as a level between 21 and 29  ng/mL (2).  1. IOM (Institute of Medicine). 2010. Dietary reference intakes for  calcium and D. [place]: [employer]. 2. Holick MF, [place], Bischoff-Ferrari HA, et al. Evaluation,  treatment, and prevention of vitamin D deficiency: an Endocrine  Society clinical practice guideline, JCEM. 2011 Jul; 96(7): 1911-30.  Performed at [facility], [address], [place], [place] [address]   TSH     Status: None   Collection Time: 01/15/23  7:09 PM  Result Value Ref Range   TSH 0.921 0.350 - 4.500 uIU/mL    Comment: Performed by a 3rd Generation assay with a functional sensitivity of <=0.01 uIU/mL. Performed at [facility], [address], [place], [place] [address]     Blood Alcohol level:  Lab Results  Component Value Date   ETH 147 (H) 01/13/2023  ETH 20 (H) 01/09/2023    Metabolic Disorder Labs: Lab Results  Component Value Date   HGBA1C 6.0 (H) 03/06/2013   MPG 126 (H) 03/06/2013   No results found for: "PROLACTIN" Lab Results  Component Value Date   CHOL 136 03/06/2013   TRIG 93 03/06/2013   HDL 36 (L) 03/06/2013   CHOLHDL 3.8 03/06/2013   VLDL 19 03/06/2013   LDLCALC 81 03/06/2013    Physical Findings: AIMS:  , ,  ,  ,    CIWA:  CIWA-Ar Total: 1 COWS:     Psychiatric Specialty Exam:  Presentation  General Appearance: Appropriate for  Environment  Eye Contact: Good  Speech: Normal Rate  Speech Volume: Normal  Handedness: Right   Mood and Affect  Mood: Dysphoric  Affect: Restricted   Thought Process  Thought Processes: Linear  Descriptions of Associations: Intact  Orientation: Full (Time, Place and Person)  Thought Content: Logical  History of Schizophrenia/Schizoaffective disorder: No data recorded Duration of Psychotic Symptoms: NA Hallucinations: Hallucinations: None  Ideas of Reference: None  Suicidal Thoughts: Suicidal Thoughts: No SI Passive Intent and/or Plan: Without Intent  Homicidal Thoughts: Homicidal Thoughts: No   Sensorium  Memory: Immediate Good  Judgment: Fair  Insight: Fair   Art therapist  Concentration: Good  Attention Span: Good  Recall: Good  Fund of Knowledge: Good  Language: Good   Psychomotor Activity  Psychomotor Activity: Psychomotor Activity: Normal   Assets  Assets: Communication Skills; Leisure Time   Sleep  Sleep: Sleep: Good Number of Hours of Sleep: 8.5   Musculoskeletal: Strength & Muscle Tone: within normal limits Gait & Station: normal Patient leans: N/A   Physical Exam: General: Sitting comfortably. NAD. HEENT: Normocephalic, atraumatic, MMM, EMOI Lungs: no increased work of breathing noted Heart: no cyanosis Abdomen: Non distended Musculoskeletal: FROM. No obvious deformities Skin: Warm, dry, intact. No rashes noted Neuro: No obvious focal deficits.  Gait and station are normal  Review of Systems  Constitutional: Negative.   HENT: Negative.    Eyes: Negative.   Respiratory: Negative.    Cardiovascular: Negative.   Gastrointestinal: Negative.   Genitourinary: Negative.   Skin: Negative.   Neurological: Negative.      Blood pressure 119/89, pulse 81, temperature 98.9 F (37.2 C), temperature source Oral, resp. rate 18, height 5\' 7"  (1.702 m), weight 63.6 kg, SpO2 100%. Body mass index is 21.96  kg/m.  ASSESSMENT: Seth Jordan is an 52 y.o. male who  has a past medical history of B12 deficiency, Bipolar disease, chronic (HCC), Schizo affective schizophrenia (HCC), and Vitamin D deficiency.  He presented on 01/14/2023  2:17 PM for Substance induced mood disorder (HCC).    Diagnoses / Active Problems: Patient Active Problem List   Diagnosis Date Noted   Alcohol use disorder, severe, dependence (HCC) 01/15/2023   Alcohol withdrawal (HCC) 01/15/2023   Substance induced mood disorder (HCC) 01/14/2023   Suicidal ideations 01/13/2023   MDD (major depressive disorder), single episode, severe (HCC) 01/13/2023   Chest pain 03/14/2013   Right bundle branch block (RBBB) on electrocardiogram (ECG) 03/06/2013   Musculoskeletal chest pain 03/05/2013      PLAN: Safety and Monitoring:  -- Voluntary admission to inpatient psychiatric unit for safety, stabilization and treatment  -- Daily contact with patient to assess and evaluate symptoms and progress in treatment  -- Patient's case to be discussed in multi-disciplinary team meeting  -- Observation Level : q15 minute checks  -- Vital signs:  q12 hours  -- Precautions:  suicide, elopement, and assault  2. Psychiatric Diagnoses and Treatment:   --  Patient Active Problem List   Diagnosis Date Noted   Alcohol use disorder, severe, dependence (HCC) 01/15/2023   Alcohol withdrawal (HCC) 01/15/2023   Substance induced mood disorder (HCC) 01/14/2023   Suicidal ideations 01/13/2023   MDD (major depressive disorder), single episode, severe (HCC) 01/13/2023   Chest pain 03/14/2013   Right bundle branch block (RBBB) on electrocardiogram (ECG) 03/06/2013   Musculoskeletal chest pain 03/05/2013    -Sertraline to 50 mg daily -B12 for B12 deficiency -vitamin D for vitamin D deficiency   3. Medical Issues Being Addressed:   -- NA  Labs reviewed, unremarkable with the exception of: Vitamin D deficiency of 11.68, B12 deficiency of 153     4.  Discharge Planning:   -- Social work and case management to assist with discharge planning and identification of hospital follow-up needs prior to discharge  -- Estimated LOS: 5 days  -- Discharge Concerns: Need to establish a safety plan; Medication compliance and effectiveness  -- Discharge Goals: Return home with outpatient referrals for mental health follow-up including medication management/psychotherapy  5. Short Term Goals:  Improve ability to identify changes in lifestyle to reduce recurrence of condition, verbalize feelings, disclose and discuss suicidal ideas, demonstrate self-control, identify and develop effective coping behaviors, compliance with prescribed medications, identify triggers associated with substance abuse/mental health issues, participate in unit milieu and in scheduled group therapies   6. Long Term Goals: Improvement in symptoms so the patient is ready for discharge   --The risks/benefits/side-effects/alternatives to the medications above were discussed in detail with the patient and time was given for questions. The patient provided informed consent.   -- Metabolic profile and EKG monitoring obtained while on an atypical antipsychotic and listed in the EHR    Total Time Spent in Direct Patient Care:  I personally spent 35 minutes on the unit in direct patient care. The direct patient care time included face-to-face time with the patient, reviewing the patient's chart, communicating with other professionals, and coordinating care. Greater than 50% of this time was spent in counseling or coordinating care with the patient regarding goals of hospitalization, psycho-education, and discharge planning needs.      Criss Alvine, MD Psychiatrist  01/17/2023, 11:24 AM   I certify that inpatient services furnished can reasonably be expected to improve the patient's condition.    Portions of this note were created using voice recognition software. Minor syntax errors,  grammatical content, spelling, or punctuation errors may have occurred unintentionally. Please notify the There[facility] Parkin if the meaning of any statement is unclear.

## 2023-01-18 LAB — VITAMIN B1: Vitamin B1 (Thiamine): 152.1 nmol/L (ref 66.5–200.0)

## 2023-01-18 NOTE — Progress Notes (Signed)
   01/18/23 2153  Psych Admission Type (Psych Patients Only)  Admission Status Voluntary  Psychosocial Assessment  Patient Complaints None  Eye Contact Fair  Facial Expression Animated  Affect Appropriate to circumstance  Speech Logical/coherent  Interaction Assertive  Motor Activity Other (Comment) (WDL)  Appearance/Hygiene Unremarkable  Behavior Characteristics Appropriate to situation  Mood Pleasant  Thought Process  Coherency WDL  Content WDL  Delusions None reported or observed  Perception WDL  Hallucination None reported or observed  Judgment WDL  Confusion None  Danger to Self  Current suicidal ideation? Denies  Self-Injurious Behavior No self-injurious ideation or behavior indicators observed or expressed   Agreement Not to Harm Self Yes  Description of Agreement verbal  Danger to Others  Danger to Others None reported or observed

## 2023-01-18 NOTE — [facility] Group Notes (Signed)
[facility] Group Notes:  (Nursing)  Date:  01/18/2023  Time:  1400  Type of Therapy:  Psychoeducational Skills  Participation Level:  Active  Participation Quality:  Appropriate  Affect:  Appropriate  Cognitive:  Appropriate  Insight:  Appropriate  Engagement in Group:  Engaged  Modes of Intervention:  Activity, Exploration, Rapport Building, Socialization, and Support  Summary of Progress/Problems:Group practiced mindfulness/ made intuitive collage   [name] 01/18/2023, 4:33 PM

## 2023-01-18 NOTE — Progress Notes (Signed)
**Seth Seth** Seth Seth  01/18/2023 9:51 AM Seth Seth  MRN:  [id number] Principal Problem: Substance induced mood disorder (HCC) Diagnosis: Principal Problem:   Substance induced mood disorder (HCC) Active Problems:   Right bundle branch block (RBBB) on electrocardiogram (ECG)   Alcohol use disorder, severe, dependence (HCC)   Alcohol withdrawal (HCC)   ID & Admission Information: Seth Seth is an 52 y.o. male who  has a past medical history of B12 deficiency, Bipolar disease, chronic (HCC), Schizo affective schizophrenia (HCC), and Vitamin D deficiency.  He presented on 01/14/2023  2:17 PM for Substance induced mood disorder (HCC).   Chief Complaint: "I'm feeling a little better"  Subjective:   Case was discussed in the multidisciplinary team. MAR was reviewed and patient was compliant with medications.  No acute events occurred overnight.  Seth Seth was seen in his room during rounds.  He reported that his mood was "good" today.  He denies any new psychiatric or medical complaints.  He reports that his appetite is good.  He reports that focus and concentration are adequate.  He denies issues with energy.  He reports adequate sleep.  He denies any medication side effects.  He denies suicidal ideations, homicidal ideations, auditory hallucinations, visual hallucinations, or delusions.  Patient is interested in participating in a long-term substance abuse program.  He reports that he is particularly interested in [place].  I discussed the patient's wishes with social work, and they state that they will follow-up with him today.   Past Psychiatric and Medical Medical History:  Past Medical History:  Diagnosis Date   B12 deficiency    Bipolar disease, chronic (HCC)    Schizo affective schizophrenia (HCC)    Vitamin D deficiency     History reviewed. No pertinent surgical history.  --Denies any past psychiatric diagnoses. Per chart review, previous diagnoses of schizoaffective and  bipolar --Denies past psychotropic medications --Endorses hx of one suicide attempt ~3 years ago when he tried to OD on Advil. --Denies ever being in a psychiatric hospital or seeing a outpatient psychiatrist  Family History(Medical and Psychiatric): History reviewed. No pertinent family history. Denies    Social History:  Social History   Substance and Sexual Activity  Alcohol Use Yes     Social History   Substance and Sexual Activity  Drug Use No    Social History   Socioeconomic History   Marital status: Single    Spouse name: Not on file   Number of children: Not on file   Years of education: Not on file   Highest education level: Not on file  Occupational History   Not on file  Tobacco Use   Smoking status: Never    Passive exposure: Current   Smokeless tobacco: Never  Vaping Use   Vaping status: Never Used  Substance and Sexual Activity   Alcohol use: Yes   Drug use: No   Sexual activity: Not Currently  Other Topics Concern   Not on file  Social History Narrative   Not on file   Social Drivers of Health   Financial Resource Strain: Not on file  Food Insecurity: Food Insecurity Present (01/14/2023)   Hunger Vital Sign    Worried About Running Out of Food in the Last Year: Sometimes true    Ran Out of Food in the Last Year: Sometimes true  Transportation Needs: Unmet Transportation Needs (01/14/2023)   PRAPARE - Administrator, Civil Service (Medical): Yes    Lack of Transportation (  Non-Medical): Yes  Physical Activity: Not on file  Stress: Not on file  Social Connections: Unknown (11/12/2022)   Received from [facility]   Social Network    Social Network: Not on file        Current Medications: Current Facility-Administered Medications  Medication Dose Route Frequency Provider Last Rate Last Admin   acetaminophen (TYLENOL) tablet 650 mg  650 mg Oral Q6H PRN Eligha Bridegroom, NP       alum & mag hydroxide-simeth (MAALOX/MYLANTA)  200-200-20 MG/5ML suspension 30 mL  30 mL Oral Q4H PRN Eligha Bridegroom, NP       cyanocobalamin (VITAMIN B12) tablet 1,000 mcg  1,000 mcg Oral Daily Golda Acre, MD   1,000 mcg at 01/18/23 0813   haloperidol (HALDOL) tablet 5 mg  5 mg Oral TID PRN Eligha Bridegroom, NP       And   diphenhydrAMINE (BENADRYL) capsule 50 mg  50 mg Oral TID PRN Eligha Bridegroom, NP       haloperidol lactate (HALDOL) injection 5 mg  5 mg Intramuscular TID PRN Eligha Bridegroom, NP       And   diphenhydrAMINE (BENADRYL) injection 50 mg  50 mg Intramuscular TID PRN Eligha Bridegroom, NP       And   LORazepam (ATIVAN) injection 2 mg  2 mg Intramuscular TID PRN Eligha Bridegroom, NP       haloperidol lactate (HALDOL) injection 10 mg  10 mg Intramuscular TID PRN Eligha Bridegroom, NP       And   diphenhydrAMINE (BENADRYL) injection 50 mg  50 mg Intramuscular TID PRN Eligha Bridegroom, NP       And   LORazepam (ATIVAN) injection 2 mg  2 mg Intramuscular TID PRN Eligha Bridegroom, NP       hydrOXYzine (ATARAX) tablet 25 mg  25 mg Oral TID PRN Eligha Bridegroom, NP   25 mg at 01/17/23 2108   magnesium hydroxide (MILK OF MAGNESIA) suspension 30 mL  30 mL Oral Daily PRN Eligha Bridegroom, NP       multivitamin with minerals tablet 1 tablet  1 tablet Oral Daily Eligha Bridegroom, NP   1 tablet at 01/18/23 0813   sertraline (ZOLOFT) tablet 50 mg  50 mg Oral Daily Golda Acre, MD   50 mg at 01/18/23 0813   thiamine (Vitamin B-1) tablet 100 mg  100 mg Oral Daily Eligha Bridegroom, NP   100 mg at 01/18/23 0814   traZODone (DESYREL) tablet 50 mg  50 mg Oral QHS PRN Eligha Bridegroom, NP   50 mg at 01/17/23 2108   Vitamin D (Ergocalciferol) (DRISDOL) 1.25 MG (50000 UNIT) capsule 50,000 Units  50,000 Units Oral Q7 days Golda Acre, MD   50,000 Units at 01/16/23 1054   vitamin D3 (CHOLECALCIFEROL) tablet 2,000 Units  2,000 Units Oral Daily Golda Acre, MD   2,000 Units at 01/18/23 0813    Lab Results:  No results found for  this or any previous visit (from the past 48 hours).   Blood Alcohol level:  Lab Results  Component Value Date   ETH 147 (H) 01/13/2023   ETH 20 (H) 01/09/2023    Metabolic Disorder Labs: Lab Results  Component Value Date   HGBA1C 6.0 (H) 03/06/2013   MPG 126 (H) 03/06/2013   No results found for: "PROLACTIN" Lab Results  Component Value Date   CHOL 136 03/06/2013   TRIG 93 03/06/2013   HDL 36 (L) 03/06/2013   CHOLHDL 3.8 03/06/2013  VLDL 19 03/06/2013   LDLCALC 81 03/06/2013    Physical Findings: AIMS:  , ,  ,  ,    CIWA:  CIWA-Ar Total: 1 COWS:     Psychiatric Specialty Exam:  Presentation  General Appearance: Appropriate for Environment  Eye Contact: Good  Speech: Normal Rate  Speech Volume: Normal  Handedness: Right   Mood and Affect  Mood: Dysphoric  Affect: Restricted   Thought Process  Thought Processes: Linear  Descriptions of Associations: Intact  Orientation: Full (Time, Place and Person)  Thought Content: Logical  History of Schizophrenia/Schizoaffective disorder: No data recorded Duration of Psychotic Symptoms: NA Hallucinations: Hallucinations: None  Ideas of Reference: None  Suicidal Thoughts: Suicidal Thoughts: No  Homicidal Thoughts: Homicidal Thoughts: No   Sensorium  Memory: Immediate Good  Judgment: Fair  Insight: Fair   Art therapist  Concentration: Good  Attention Span: Good  Recall: Good  Fund of Knowledge: Good  Language: Good   Psychomotor Activity  Psychomotor Activity: Psychomotor Activity: Normal   Assets  Assets: Communication Skills; Desire for Improvement   Sleep  Sleep: Sleep: Good Number of Hours of Sleep: 5.5   Musculoskeletal: Strength & Muscle Tone: within normal limits Gait & Station: normal Patient leans: N/A   Physical Exam: General: Sitting comfortably. NAD. HEENT: Normocephalic, atraumatic, MMM, EMOI Lungs: no increased work of breathing noted Heart: no  cyanosis Abdomen: Non distended Musculoskeletal: FROM. No obvious deformities Skin: Warm, dry, intact. No rashes noted Neuro: No obvious focal deficits.  Gait and station are normal  Review of Systems  Constitutional: Negative.   HENT: Negative.    Eyes: Negative.   Respiratory: Negative.    Cardiovascular: Negative.   Gastrointestinal: Negative.   Genitourinary: Negative.   Skin: Negative.   Neurological: Negative.      Blood pressure 118/77, pulse 79, temperature 98.2 F (36.8 C), temperature source Oral, resp. rate 18, height 5\' 7"  (1.702 m), weight 63.6 kg, SpO2 99%. Body mass index is 21.96 kg/m.  ASSESSMENT: Seth Seth is an 52 y.o. male who  has a past medical history of B12 deficiency, Bipolar disease, chronic (HCC), Schizo affective schizophrenia (HCC), and Vitamin D deficiency.  He presented on 01/14/2023  2:17 PM for Substance induced mood disorder (HCC).    Diagnoses / Active Problems: Patient Active Problem List   Diagnosis Date Noted   Alcohol use disorder, severe, dependence (HCC) 01/15/2023   Alcohol withdrawal (HCC) 01/15/2023   Substance induced mood disorder (HCC) 01/14/2023   Suicidal ideations 01/13/2023   MDD (major depressive disorder), single episode, severe (HCC) 01/13/2023   Chest pain 03/14/2013   Right bundle branch block (RBBB) on electrocardiogram (ECG) 03/06/2013   Musculoskeletal chest pain 03/05/2013      PLAN: Safety and Monitoring:  -- Voluntary admission to inpatient psychiatric unit for safety, stabilization and treatment  -- Daily contact with patient to assess and evaluate symptoms and progress in treatment  -- Patient's case to be discussed in multi-disciplinary team meeting  -- Observation Level : q15 minute checks  -- Vital signs:  q12 hours  -- Precautions: suicide, elopement, and assault  2. Psychiatric Diagnoses and Treatment:   --  Patient Active Problem List   Diagnosis Date Noted   Alcohol use disorder, severe,  dependence (HCC) 01/15/2023   Alcohol withdrawal (HCC) 01/15/2023   Substance induced mood disorder (HCC) 01/14/2023   Suicidal ideations 01/13/2023   MDD (major depressive disorder), single episode, severe (HCC) 01/13/2023   Chest pain 03/14/2013   Right  bundle branch block (RBBB) on electrocardiogram (ECG) 03/06/2013   Musculoskeletal chest pain 03/05/2013    -Sertraline 50 mg daily for mood -B12 1000 mcg daily for B12 deficiency -Ergocalciferol 50,000 units weekly and 2000 units daily for vitamin D deficiency -Multivitamin for alcohol use disorder; discussed naltrexone but patient denies cravings and is emphatic that he has done drinking after this admission   vitamin B-12  1,000 mcg Oral Daily   multivitamin with minerals  1 tablet Oral Daily   sertraline  50 mg Oral Daily   thiamine  100 mg Oral Daily   Vitamin D (Ergocalciferol)  50,000 Units Oral Q7 days   cholecalciferol  2,000 Units Oral Daily      3. Medical Issues Being Addressed:   -- NA  Labs reviewed, unremarkable with the exception of: Vitamin D deficiency of 11.68, B12 deficiency of 153     4. Discharge Planning:   -- Social work and case management to assist with discharge planning and identification of hospital follow-up needs prior to discharge  -- Estimated LOS: Expected discharge on 12/17  -- Discharge Concerns: Need to establish a safety plan; Medication compliance and effectiveness  -- Discharge Goals: Return home with outpatient referrals for mental health follow-up including medication management/psychotherapy  5. Short Term Goals:  Improve ability to identify changes in lifestyle to reduce recurrence of condition, verbalize feelings, disclose and discuss suicidal ideas, demonstrate self-control, identify and develop effective coping behaviors, compliance with prescribed medications, identify triggers associated with substance abuse/mental health issues, participate in unit milieu and in scheduled group  therapies   6. Long Term Goals: Improvement in symptoms so the patient is ready for discharge   --The risks/benefits/side-effects/alternatives to the medications above were discussed in detail with the patient and time was given for questions. The patient provided informed consent.   -- Metabolic profile and EKG monitoring obtained while on an atypical antipsychotic and listed in the EHR    Total Time Spent in Direct Patient Care:  I personally spent 35 minutes on the unit in direct patient care. The direct patient care time included face-to-face time with the patient, reviewing the patient's chart, communicating with other professionals, and coordinating care. Greater than 50% of this time was spent in counseling or coordinating care with the patient regarding goals of hospitalization, psycho-education, and discharge planning needs.      Criss Alvine, MD Psychiatrist  01/18/2023, 9:51 AM   I certify that inpatient services furnished can reasonably be expected to improve the patient's condition.    Portions of this Seth were created using voice recognition software. Minor syntax errors, grammatical content, spelling, or punctuation errors may have occurred unintentionally. Please notify the Thereasa Parkin if the meaning of any statement is unclear.

## 2023-01-18 NOTE — [facility] Group Notes (Signed)
[facility] Group Notes:  (Nursing/MHT/Case Management/Adjunct)  Date:  01/18/2023  Time:  2000  Type of Therapy:   Wrap up group  Participation Level:  Active  Participation Quality:  Appropriate, Attentive, Sharing, and Supportive  Affect:  Appropriate  Cognitive:  Alert  Insight:  Improving  Engagement in Group:  Engaged  Modes of Intervention:  Clarification, Education, and Socialization  Summary of Progress/Problems: Positive thinking and self-care were discussed.   Marcille Buffy 01/18/2023, 8:51 PM

## 2023-01-18 NOTE — [facility] Group Notes (Signed)
Pt did not attend music therapy

## 2023-01-18 NOTE — Progress Notes (Signed)
   01/18/23 1000  Psych Admission Type (Psych Patients Only)  Admission Status Voluntary  Psychosocial Assessment  Patient Complaints None  Eye Contact Fair  Facial Expression Animated  Affect Appropriate to circumstance  Speech Logical/coherent  Interaction Assertive  Motor Activity Other (Comment) (level 3 observation)  Appearance/Hygiene Unremarkable  Behavior Characteristics Cooperative;Appropriate to situation  Mood Pleasant  Thought Process  Coherency WDL  Content WDL  Delusions None reported or observed  Perception WDL  Hallucination None reported or observed  Judgment WDL  Confusion None  Danger to Self  Current suicidal ideation? Denies  Self-Injurious Behavior No self-injurious ideation or behavior indicators observed or expressed   Danger to Others  Danger to Others None reported or observed

## 2023-01-18 NOTE — Progress Notes (Signed)
   01/18/23 0532  15 Minute Checks  Location Bedroom  Visual Appearance Calm  Behavior Composed  Sleep (Behavioral Health Patients Only)  Calculate sleep? (Click Yes once per 24 hr at 0600 safety check) Yes  Documented sleep last 24 hours 5.5

## 2023-01-18 NOTE — Plan of Care (Signed)
  Problem: Education: Goal: Verbalization of understanding the information provided will improve Outcome: Progressing   Problem: Activity: Goal: Interest or engagement in activities will improve Outcome: Progressing   

## 2023-01-18 NOTE — [facility] Group Notes (Signed)
Type of Therapy and Topic:  Group Therapy: Mindful vs Mind Full  Participation Level: Did Not Attend   Description of Group:   In this group, patients shared and discussed the importance of acknowledging the elements in their lives for when they are mindful and mind full and how this can positively impact their mood.  The group discussed how being mindful of their surroundings can benefit the decisions they make and how it can affect others. The group also discussed how being mind full can impact the way decisions are ineffective. An exercise was done as a group in which a list was made of mindfulness items in order to encourage participants to consider other potential positives in their lives.  Therapeutic Goals: Patients will identify one or more item for which they are mindfulness living through their day and who it can affect:  people, experiences, things, places, skills, and other. Patients will discuss how it is possible to apply a mindful mind to an event in their life. Patients will explore other possible items of a mindful vs mind full that they could remember.    Summary of Patient Progress:  NA.  Therapeutic Modalities:   Solution-Focused Therapy

## 2023-01-18 NOTE — Plan of Care (Signed)
  Problem: Education: Goal: Knowledge of [facility] Education information/materials will improve Outcome: Progressing Goal: Emotional status will improve Outcome: Progressing Goal: Mental status will improve Outcome: Progressing Goal: Verbalization of understanding the information provided will improve Outcome: Progressing   

## 2023-01-18 NOTE — [facility] Group Notes (Signed)
Pt did not attend goals group. 

## 2023-01-19 ENCOUNTER — Encounter (HOSPITAL_COMMUNITY): Payer: Self-pay

## 2023-01-19 MED ORDER — SERTRALINE HCL 100 MG PO TABS
100.0000 mg | ORAL_TABLET | Freq: Every day | ORAL | Status: DC
  Administered 2023-01-20 – 2023-01-22 (×3): 100 mg via ORAL
  Filled 2023-01-19 (×4): qty 1

## 2023-01-19 NOTE — Progress Notes (Signed)
   01/19/23 0527  15 Minute Checks  Location Bedroom  Visual Appearance Calm  Behavior Sleeping  Sleep (Behavioral Health Patients Only)  Calculate sleep? (Click Yes once per 24 hr at 0600 safety check) Yes  Documented sleep last 24 hours 6.5

## 2023-01-19 NOTE — Group Note (Signed)
Recreation Therapy Group Note   Group Topic:Stress Management  Group Date: 01/19/2023 Start Time: 0931 End Time: 0951 Facilitators: Krysten Veronica-McCall, LRT,CTRS Location: [address] Dayroom   Group Topic: Stress Management  Goal Area(s) Addresses:  Patient will identify positive stress management techniques. Patient will identify benefits of using stress management post d/c.  Group Description: Meditation. LRT engaged with patients about the benefits of meditation. LRT then played a meditation for patients that focused on self renewal and building confidence in ones self.    Education:  Stress Management, Discharge Planning.   Education Outcome: Acknowledges Education   Affect/Mood: Appropriate   Participation Level: Engaged   Participation Quality: Independent   Behavior: Appropriate   Speech/Thought Process: Focused   Insight: Good   Judgement: Good   Modes of Intervention: Meditation   Patient Response to Interventions:  Engaged   Education Outcome:  In group clarification offered    Clinical Observations/Individualized Feedback: Pt attended and participated in group session.      Plan: Continue to engage patient in RT group sessions 2-3x/week.   Dequon Schnebly-McCall, LRT,CTRS 01/19/2023 12:48 PM

## 2023-01-19 NOTE — Plan of Care (Signed)
  Problem: Education: Goal: Emotional status will improve Outcome: Progressing Goal: Mental status will improve Outcome: Progressing   Problem: Activity: Goal: Interest or engagement in activities will improve Outcome: Progressing Goal: Sleeping patterns will improve Outcome: Progressing   Problem: Safety: Goal: Periods of time without injury will increase Outcome: Progressing   

## 2023-01-19 NOTE — Plan of Care (Signed)
  Problem: Education: Goal: Emotional status will improve Outcome: Progressing   Problem: Education: Goal: Mental status will improve Outcome: Progressing   

## 2023-01-19 NOTE — Progress Notes (Signed)
   01/19/23 2045  Psych Admission Type (Psych Patients Only)  Admission Status Voluntary  Psychosocial Assessment  Patient Complaints None  Eye Contact Fair  Facial Expression Anxious  Affect Appropriate to circumstance  Speech Logical/coherent  Interaction Assertive  Motor Activity Slow  Appearance/Hygiene Unremarkable  Behavior Characteristics Cooperative  Mood Depressed;Anxious  Thought Process  Coherency WDL  Content WDL  Delusions WDL  Perception WDL  Hallucination None reported or observed  Judgment Impaired  Confusion WDL  Danger to Self  Current suicidal ideation? Denies  Danger to Others  Danger to Others None reported or observed

## 2023-01-19 NOTE — [facility] Group Notes (Signed)
[facility] Group Notes:  (Nursing/MHT/Case Management/Adjunct)  Date:  01/19/2023  Time:  9:14 PM  Type of Therapy:  Psychoeducational Skills  Participation Level:  Active  Participation Quality:  Appropriate  Affect:  Appropriate  Cognitive:  Appropriate  Insight:  Good  Engagement in Group:  Improving  Modes of Intervention:  Education  Summary of Progress/Problems: Patient attended the evening A.A. meeting and shared appropriately with the group.   Hazle Coca S 01/19/2023, 9:14 PM

## 2023-01-19 NOTE — Group Note (Signed)
Date:  01/19/2023 Time:  11:36 AM  Group Topic/Focus:   Healthy Communication:   The focus of this group is to discuss communication, barriers to communication, as well as healthy ways to communicate with others (Boundaries).     Participation Level:  Minimal  Participation Quality:  Appropriate  Affect:  Appropriate  Cognitive:  Appropriate  Insight: Appropriate  Engagement in Group:  Improving  Modes of Intervention:  Discussion and Education  Additional Comments:    Arnoldo Hooker 01/19/2023, 11:36 AM

## 2023-01-19 NOTE — Group Note (Signed)
Date:  01/19/2023 Time:  9:08 AM  Group Topic/Focus:  Goals Group:   The focus of this group is to help patients establish daily goals to achieve during treatment and discuss how the patient can incorporate goal setting into their daily lives to aide in recovery. Orientation:   The focus of this group is to educate the patient on the purpose and policies of crisis stabilization and provide a format to answer questions about their admission.  The group details unit policies and expectations of patients while admitted.    Participation Level:  Active  Participation Quality:  Appropriate  Affect:  Appropriate  Cognitive:  Appropriate  Insight: Appropriate  Engagement in Group:  Engaged  Modes of Intervention:  Discussion  Additional Comments:    [name] D [name] 01/19/2023, 9:08 AM

## 2023-01-19 NOTE — Plan of Care (Signed)
  Problem: Education: Goal: Emotional status will improve Outcome: Progressing Goal: Mental status will improve Outcome: Progressing   Problem: Activity: Goal: Interest or engagement in activities will improve Outcome: Progressing Goal: Sleeping patterns will improve Outcome: Progressing

## 2023-01-19 NOTE — [facility] Group Notes (Signed)
Spiritual care group on grief and loss facilitated by Chaplain Dyanne Carrel, [facility]  Group Goal: Support / Education around grief and loss  Members engage in facilitated group support and psycho-social education.  Group Description:  Following introductions and group rules, group members engaged in facilitated group dialogue and support around topic of loss, with particular support around experiences of loss in their lives. Group Identified types of loss (relationships / self / things) and identified patterns, circumstances, and changes that precipitate losses. Reflected on thoughts / feelings around loss, normalized grief responses, and recognized variety in grief experience. Group encouraged individual reflection on safe space and on the coping skills that they are already utilizing.  Group drew on Adlerian / Rogerian and narrative framework  Patient Progress: Seth Jordan attended group and actively engaged and participated in group conversation and activities.  Comments demonstrated good insight and contributed positively to the group conversation.

## 2023-01-19 NOTE — Group Note (Signed)
Occupational Therapy Group Note  Group Topic: Sleep Hygiene  Group Date: 01/19/2023 Start Time: 1430 End Time: 1500 Facilitators: Ted Mcalpine, OT   Group Description: Group encouraged increased participation and engagement through topic focused on sleep hygiene. Patients reflected on the quality of sleep they typically receive and identified areas that need improvement. Group was given background information on sleep and sleep hygiene, including common sleep disorders. Group members also received information on how to improve one's sleep and introduced a sleep diary as a tool that can be utilized to track sleep quality over a length of time. Group session ended with patients identifying one or more strategies they could utilize or implement into their sleep routine in order to improve overall sleep quality.        Therapeutic Goal(s):  Identify one or more strategies to improve overall sleep hygiene  Identify one or more areas of sleep that are negatively impacted (sleep too much, too little, etc)     Participation Level: Engaged   Participation Quality: Independent   Behavior: Appropriate   Speech/Thought Process: Relevant   Affect/Mood: Appropriate   Insight: Fair   Judgement: Fair      Modes of Intervention: Education  Patient Response to Interventions:  Attentive   Plan: Continue to engage patient in OT groups 2 - 3x/week.  01/19/2023  Ted Mcalpine, OT  Kerrin Champagne, OT

## 2023-01-19 NOTE — Progress Notes (Addendum)
[facility] MD Progress Note  01/19/2023 8:50 AM Seth Jordan  MRN:  [id number] Principal Problem: Substance induced mood disorder (HCC) Diagnosis: Principal Problem:   Substance induced mood disorder (HCC) Active Problems:   Right bundle branch block (RBBB) on electrocardiogram (ECG)   Alcohol use disorder, severe, dependence (HCC)   Alcohol withdrawal (HCC)   ID & Admission Information: Seth Jordan is an 52 y.o. male who  has a past medical history of B12 deficiency, Bipolar disease, chronic (HCC), Schizo affective schizophrenia (HCC), and Vitamin D deficiency.  He presented on 01/14/2023  2:17 PM for Substance induced mood disorder (HCC).   Chief Complaint: "I'm feeling a little anxious."  Subjective:   Case was discussed in the multidisciplinary team. MAR was reviewed and patient was compliant with medications.  No acute events occurred overnight.   On assessment this AM patient reports that he is not as depressed and feels that the medication is working. Patient reports that he did wake up feeling a bit anxious and endorses that this is because he is also still feeling some hopelessness and worthlessness. Patient reports that he is worried about being able to maintain sobriety. Patient denies SI, HI, and AVH. Patient reports that he is sleeping well and had a good appetite. Last BM was this AM. Patient denies cravings but endorses anxiety about being able to maintain sobriety outside control environment.   Provider discussed with patient possibly starting Naltrexone to help increase chance of maintained sobriety; however patient endorsed he likely would not be able to afford it, but could probably afford Zoloft outpatient. Patient was open to the medication if he could afford.   Past Psychiatric and Medical Medical History:  Past Medical History:  Diagnosis Date   B12 deficiency    Bipolar disease, chronic (HCC)    Schizo affective schizophrenia (HCC)    Vitamin D deficiency      History reviewed. No pertinent surgical history.  --Denies any past psychiatric diagnoses. Per chart review, previous diagnoses of schizoaffective and bipolar --Denies past psychotropic medications --Endorses hx of one suicide attempt ~3 years ago when he tried to OD on Advil. --Denies ever being in a psychiatric hospital or seeing a outpatient psychiatrist  Family History(Medical and Psychiatric): History reviewed. No pertinent family history. Denies    Social History:  Social History   Substance and Sexual Activity  Alcohol Use Yes     Social History   Substance and Sexual Activity  Drug Use No    Social History   Socioeconomic History   Marital status: Single    Spouse name: Not on file   Number of children: Not on file   Years of education: Not on file   Highest education level: Not on file  Occupational History   Not on file  Tobacco Use   Smoking status: Never    Passive exposure: Current   Smokeless tobacco: Never  Vaping Use   Vaping status: Never Used  Substance and Sexual Activity   Alcohol use: Yes   Drug use: No   Sexual activity: Not Currently  Other Topics Concern   Not on file  Social History Narrative   Not on file   Social Drivers of Health   Financial Resource Strain: Not on file  Food Insecurity: Food Insecurity Present (01/14/2023)   Hunger Vital Sign    Worried About Running Out of Food in the Last Year: Sometimes true    Ran Out of Food in the Last Year: Sometimes true  Transportation Needs: Unmet Transportation Needs (01/14/2023)   PRAPARE - Administrator, Civil Service (Medical): Yes    Lack of Transportation (Non-Medical): Yes  Physical Activity: Not on file  Stress: Not on file  Social Connections: Unknown (11/12/2022)   Received from [employer]   Social Network    Social Network: Not on file        Current Medications: Current Facility-Administered Medications  Medication Dose Route Frequency Provider  Last Rate Last Admin   acetaminophen (TYLENOL) tablet 650 mg  650 mg Oral Q6H PRN Eligha Bridegroom, NP       alum & mag hydroxide-simeth (MAALOX/MYLANTA) 200-200-20 MG/5ML suspension 30 mL  30 mL Oral Q4H PRN Eligha Bridegroom, NP       cyanocobalamin (VITAMIN B12) tablet 1,000 mcg  1,000 mcg Oral Daily Golda Acre, MD   1,000 mcg at 01/19/23 0734   haloperidol (HALDOL) tablet 5 mg  5 mg Oral TID PRN Eligha Bridegroom, NP       And   diphenhydrAMINE (BENADRYL) capsule 50 mg  50 mg Oral TID PRN Eligha Bridegroom, NP       haloperidol lactate (HALDOL) injection 5 mg  5 mg Intramuscular TID PRN Eligha Bridegroom, NP       And   diphenhydrAMINE (BENADRYL) injection 50 mg  50 mg Intramuscular TID PRN Eligha Bridegroom, NP       And   LORazepam (ATIVAN) injection 2 mg  2 mg Intramuscular TID PRN Eligha Bridegroom, NP       haloperidol lactate (HALDOL) injection 10 mg  10 mg Intramuscular TID PRN Eligha Bridegroom, NP       And   diphenhydrAMINE (BENADRYL) injection 50 mg  50 mg Intramuscular TID PRN Eligha Bridegroom, NP       And   LORazepam (ATIVAN) injection 2 mg  2 mg Intramuscular TID PRN Eligha Bridegroom, NP       hydrOXYzine (ATARAX) tablet 25 mg  25 mg Oral TID PRN Eligha Bridegroom, NP   25 mg at 01/18/23 2100   magnesium hydroxide (MILK OF MAGNESIA) suspension 30 mL  30 mL Oral Daily PRN Eligha Bridegroom, NP       multivitamin with minerals tablet 1 tablet  1 tablet Oral Daily Eligha Bridegroom, NP   1 tablet at 01/19/23 0732   sertraline (ZOLOFT) tablet 50 mg  50 mg Oral Daily Golda Acre, MD   50 mg at 01/19/23 0732   thiamine (Vitamin B-1) tablet 100 mg  100 mg Oral Daily Eligha Bridegroom, NP   100 mg at 01/19/23 0732   traZODone (DESYREL) tablet 50 mg  50 mg Oral QHS PRN Eligha Bridegroom, NP   50 mg at 01/18/23 2100   Vitamin D (Ergocalciferol) (DRISDOL) 1.25 MG (50000 UNIT) capsule 50,000 Units  50,000 Units Oral Q7 days Golda Acre, MD   50,000 Units at 01/16/23 1054   vitamin D3  (CHOLECALCIFEROL) tablet 2,000 Units  2,000 Units Oral Daily Golda Acre, MD   2,000 Units at 01/19/23 0732    Lab Results:  No results found for this or any previous visit (from the past 48 hours).   Blood Alcohol level:  Lab Results  Component Value Date   ETH 147 (H) 01/13/2023   ETH 20 (H) 01/09/2023    Metabolic Disorder Labs: Lab Results  Component Value Date   HGBA1C 6.0 (H) 03/06/2013   MPG 126 (H) 03/06/2013   No results found for: "PROLACTIN" Lab Results  Component Value Date   CHOL 136 03/06/2013   TRIG 93 03/06/2013   HDL 36 (L) 03/06/2013   CHOLHDL 3.8 03/06/2013   VLDL 19 03/06/2013   LDLCALC 81 03/06/2013    Physical Findings: AIMS:  , ,  ,  ,    CIWA:  CIWA-Ar Total: 1 COWS:     Psychiatric Specialty Exam:  Presentation  General Appearance: Appropriate for Environment  Eye Contact: Good  Speech: Clear and Coherent  Speech Volume: Normal  Handedness: Right   Mood and Affect  Mood: Anxious  Affect: Appropriate   Thought Process  Thought Processes: Coherent  Descriptions of Associations: Intact  Orientation: Full (Time, Place and Person)  Thought Content: Logical  History of Schizophrenia/Schizoaffective disorder: No data recorded Duration of Psychotic Symptoms: NA Hallucinations: Hallucinations: None  Ideas of Reference: None  Suicidal Thoughts: Suicidal Thoughts: No  Homicidal Thoughts: Homicidal Thoughts: No   Sensorium  Memory: Immediate Good; Recent Good  Judgment: Fair  Insight: Fair   Art therapist  Concentration: Good  Attention Span: Good  Recall: Fair  Fund of Knowledge: Fair  Language: Good   Psychomotor Activity  Psychomotor Activity: Psychomotor Activity: Normal   Assets  Assets: Communication Skills; Desire for Improvement; Resilience   Sleep  Sleep: Sleep: Good Number of Hours of Sleep: 6.5   Musculoskeletal: Strength & Muscle Tone: within normal limits Gait & Station:  normal Patient leans: N/A   Physical Exam: General: Sitting comfortably. NAD. HEENT: Normocephalic, atraumatic, MMM, EMOI Lungs: no increased work of breathing noted Heart: no cyanosis Abdomen: Non distended Musculoskeletal: FROM. No obvious deformities Skin: Warm, dry, intact. No rashes noted Neuro: No obvious focal deficits.  Gait and station are normal  Review of Systems  Constitutional: Negative.   HENT: Negative.    Eyes: Negative.   Respiratory: Negative.    Cardiovascular: Negative.   Gastrointestinal: Negative.   Genitourinary: Negative.   Skin: Negative.   Neurological: Negative.      Blood pressure 112/80, pulse 75, temperature 98.9 F (37.2 C), temperature source Oral, resp. rate 16, height 5\' 7"  (1.702 m), weight 63.6 kg, SpO2 100%. Body mass index is 21.96 kg/m.  ASSESSMENT: Seth Jordan is an 52 y.o. male who  has a past medical history of B12 deficiency, Bipolar disease, chronic (HCC), Schizo affective schizophrenia (HCC), and Vitamin D deficiency.  He presented on 01/14/2023  2:17 PM for Substance induced mood disorder (HCC).    Will increase Zoloft tom to address continued depression symptoms and anxiety, will continue titration up to a therapeutic dose of Zoloft. At this time patient would not likely be able to maintain compliance with Naltrexone. Pharmacy agreed that patient would likely have to pay out of pocket for naltrexone.   Diagnoses / Active Problems: Patient Active Problem List   Diagnosis Date Noted   Alcohol use disorder, severe, dependence (HCC) 01/15/2023   Alcohol withdrawal (HCC) 01/15/2023   Substance induced mood disorder (HCC) 01/14/2023   Suicidal ideations 01/13/2023   MDD (major depressive disorder), single episode, severe (HCC) 01/13/2023   Chest pain 03/14/2013   Right bundle branch block (RBBB) on electrocardiogram (ECG) 03/06/2013   Musculoskeletal chest pain 03/05/2013      PLAN: Safety and Monitoring:  -- Voluntary  admission to inpatient psychiatric unit for safety, stabilization and treatment  -- Daily contact with patient to assess and evaluate symptoms and progress in treatment  -- Patient's case to be discussed in multi-disciplinary team meeting  -- Observation Level : q15 minute  checks  -- Vital signs:  q12 hours  -- Precautions: suicide, elopement, and assault  2. Psychiatric Diagnoses and Treatment:   --  Patient Active Problem List   Diagnosis Date Noted   Alcohol use disorder, severe, dependence (HCC) 01/15/2023   Alcohol withdrawal (HCC) 01/15/2023   Substance induced mood disorder (HCC) 01/14/2023   Suicidal ideations 01/13/2023   MDD (major depressive disorder), single episode, severe (HCC) 01/13/2023   Chest pain 03/14/2013   Right bundle branch block (RBBB) on electrocardiogram (ECG) 03/06/2013   Musculoskeletal chest pain 03/05/2013    -Increase Sertraline to 100 mg daily for mood -B12 1000 mcg daily for B12 deficiency -Ergocalciferol 50,000 units weekly and 2000 units daily for vitamin D deficiency -Multivitamin for alcohol use disorder; discussed naltrexone but patient denies cravings and is emphatic that he has done drinking after this admission also unable to afford   vitamin B-12  1,000 mcg Oral Daily   multivitamin with minerals  1 tablet Oral Daily   sertraline  50 mg Oral Daily   thiamine  100 mg Oral Daily   Vitamin D (Ergocalciferol)  50,000 Units Oral Q7 days   cholecalciferol  2,000 Units Oral Daily      3. Medical Issues Being Addressed:   -- NA  Labs reviewed, unremarkable with the exception of: Vitamin D deficiency of 11.68, B12 deficiency of 153     4. Discharge Planning:   -- Social work and case management to assist with discharge planning and identification of hospital follow-up needs prior to discharge  -- Estimated LOS: Expected discharge on 12/17  -- Discharge Concerns: Need to establish a safety plan; Medication compliance and effectiveness  --  Discharge Goals: Return home with outpatient referrals for mental health follow-up including medication management/psychotherapy  5. Short Term Goals:  Improve ability to identify changes in lifestyle to reduce recurrence of condition, verbalize feelings, disclose and discuss suicidal ideas, demonstrate self-control, identify and develop effective coping behaviors, compliance with prescribed medications, identify triggers associated with substance abuse/mental health issues, participate in unit milieu and in scheduled group therapies   6. Long Term Goals: Improvement in symptoms so the patient is ready for discharge   --The risks/benefits/side-effects/alternatives to the medications above were discussed in detail with the patient and time was given for questions. The patient provided informed consent.   -- Metabolic profile and EKG monitoring obtained while on an atypical antipsychotic and listed in the EHR    Total Time Spent in Direct Patient Care:  I personally spent 15 minutes on the unit in direct patient care. The direct patient care time included face-to-face time with the patient, reviewing the patient's chart, communicating with other professionals, and coordinating care. Greater than 50% of this time was spent in counseling or coordinating care with the patient regarding goals of hospitalization, psycho-education, and discharge planning needs.     PGY-4 Eliseo Gum, MD Psychiatrist  01/19/2023, 8:50 AM   I certify that inpatient services furnished can reasonably be expected to improve the patient's condition.    Portions of this note were created using voice recognition software. Minor syntax errors, grammatical content, spelling, or punctuation errors may have occurred unintentionally. Please notify the Thereasa Parkin if the meaning of any statement is unclear.

## 2023-01-19 NOTE — Progress Notes (Addendum)
Pt denied SI/HI/AVH this morning. Pt rated his depression a 0/10, anxiety a 0/10. Pt reports that he is worried about finding housing when he leaves here. Pt has been pleasant, calm, and cooperative throughout the shift. RN provided support and encouragement to patient. Pt given scheduled medications as prescribed. Q15 min checks verified for safety. Patient verbally contracts for safety. Patient compliant with medications and treatment plan. Patient is interacting well on the unit. Pt is safe on the unit.   01/19/23 0900  Psych Admission Type (Psych Patients Only)  Admission Status Voluntary  Psychosocial Assessment  Patient Complaints None  Eye Contact Fair  Facial Expression Animated  Affect Appropriate to circumstance  Speech Logical/coherent  Interaction Assertive  Motor Activity Other (Comment) (WDL)  Appearance/Hygiene Unremarkable  Behavior Characteristics Cooperative;Appropriate to situation  Mood Depressed;Anxious  Thought Process  Coherency WDL  Content WDL  Delusions None reported or observed  Perception WDL  Hallucination None reported or observed  Judgment Impaired  Confusion None  Danger to Self  Current suicidal ideation? Denies  Self-Injurious Behavior No self-injurious ideation or behavior indicators observed or expressed   Agreement Not to Harm Self Yes  Description of Agreement Verbal  Danger to Others  Danger to Others None reported or observed

## 2023-01-20 DIAGNOSIS — F1994 Other psychoactive substance use, unspecified with psychoactive substance-induced mood disorder: Secondary | ICD-10-CM | POA: Diagnosis not present

## 2023-01-20 NOTE — Plan of Care (Signed)
  Problem: Education: Goal: Emotional status will improve Outcome: Progressing Goal: Mental status will improve Outcome: Progressing   Problem: Activity: Goal: Interest or engagement in activities will improve Outcome: Progressing Goal: Sleeping patterns will improve Outcome: Progressing   Problem: Safety: Goal: Periods of time without injury will increase Outcome: Progressing   

## 2023-01-20 NOTE — Progress Notes (Signed)
   01/20/23 1500  Psych Admission Type (Psych Patients Only)  Admission Status Voluntary  Psychosocial Assessment  Patient Complaints None  Eye Contact Fair  Facial Expression Anxious  Affect Appropriate to circumstance  Speech Logical/coherent  Interaction Assertive  Motor Activity Slow  Appearance/Hygiene Unremarkable  Behavior Characteristics Cooperative  Mood Depressed;Anxious  Thought Process  Coherency WDL  Content WDL  Delusions WDL  Perception WDL  Hallucination None reported or observed  Judgment Impaired  Confusion WDL  Danger to Self  Current suicidal ideation? Denies  Danger to Others  Danger to Others None reported or observed

## 2023-01-20 NOTE — Progress Notes (Signed)
[facility] MD Progress Note  01/20/2023 11:11 AM Seth Jordan  MRN:  [id number] Principal Problem: Substance induced mood disorder (HCC) Diagnosis: Principal Problem:   Substance induced mood disorder (HCC) Active Problems:   Right bundle branch block (RBBB) on electrocardiogram (ECG)   Alcohol use disorder, severe, dependence (HCC)   Alcohol withdrawal (HCC)   ID & Admission Information: Seth Jordan is an 52 y.o. male who  has a past medical history of B12 deficiency, Bipolar disease, chronic (HCC), Schizo affective schizophrenia (HCC), and Vitamin D deficiency.  He presented on 01/14/2023  2:17 PM for Substance induced mood disorder (HCC).   Chief Complaint:" I am ok"  Subjective:   The patient was seen today and the chart was reviewed and the case was discussed with the treatment team.  MAR was reviewed and the patient is apparently compliant with medications.  He slept fair.  Staff reported 6.75 hours.  He has some difficulty falling asleep because of ruminating thoughts.  On assessment today,the patient is alert oriented and cooperative but reports that he continues to feel depressed and is worried about placement.  He is homeless and does want to go to a shelter because of his frequent relapses.  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  He wants to break the cycle.  He reports that he also wants to get a job.  Today he denies any cravings and denies any SI/HI/AVH.  He is currently on the Zoloft 100 mg a day with no side effects.  He is contracting for safety. The plan is to discharge him by Thursday to a shelter with a safety plan.  He may also be willing to go to [facility] program.    Past Psychiatric and Medical Medical History:  Past Medical History:  Diagnosis Date   B12 deficiency    Bipolar disease, chronic (HCC)    Schizo affective schizophrenia (HCC)    Vitamin D deficiency     History reviewed. No pertinent surgical history.  --Denies any past psychiatric  diagnoses. Per chart review, previous diagnoses of schizoaffective and bipolar --Denies past psychotropic medications --Endorses hx of one suicide attempt ~3 years ago when he tried to OD on Advil. --Denies ever being in a psychiatric hospital or seeing a outpatient psychiatrist  Family History(Medical and Psychiatric): History reviewed. No pertinent family history. Denies    Social History:  Social History   Substance and Sexual Activity  Alcohol Use Yes     Social History   Substance and Sexual Activity  Drug Use No    Social History   Socioeconomic History   Marital status: Single    Spouse name: Not on file   Number of children: Not on file   Years of education: Not on file   Highest education level: Not on file  Occupational History   Not on file  Tobacco Use   Smoking status: Never    Passive exposure: Current   Smokeless tobacco: Never  Vaping Use   Vaping status: Never Used  Substance and Sexual Activity   Alcohol use: Yes   Drug use: No   Sexual activity: Not Currently  Other Topics Concern   Not on file  Social History Narrative   Not on file   Social Drivers of Health   Financial Resource Strain: Not on file  Food Insecurity: Food Insecurity Present (01/14/2023)   Hunger Vital Sign    Worried About Running Out of Food in the Last Year: Sometimes true  Ran Out of Food in the Last Year: Sometimes true  Transportation Needs: Unmet Transportation Needs (01/14/2023)   PRAPARE - Administrator, Civil Service (Medical): Yes    Lack of Transportation (Non-Medical): Yes  Physical Activity: Not on file  Stress: Not on file  Social Connections: Unknown (11/12/2022)   Received from [employer]   Social Network    Social Network: Not on file        Current Medications: Current Facility-Administered Medications  Medication Dose Route Frequency Provider Last Rate Last Admin   acetaminophen (TYLENOL) tablet 650 mg  650 mg Oral Q6H PRN  Eligha Bridegroom, NP       alum & mag hydroxide-simeth (MAALOX/MYLANTA) 200-200-20 MG/5ML suspension 30 mL  30 mL Oral Q4H PRN Eligha Bridegroom, NP       cyanocobalamin (VITAMIN B12) tablet 1,000 mcg  1,000 mcg Oral Daily Golda Acre, MD   1,000 mcg at 01/20/23 0751   haloperidol (HALDOL) tablet 5 mg  5 mg Oral TID PRN Eligha Bridegroom, NP       And   diphenhydrAMINE (BENADRYL) capsule 50 mg  50 mg Oral TID PRN Eligha Bridegroom, NP       haloperidol lactate (HALDOL) injection 5 mg  5 mg Intramuscular TID PRN Eligha Bridegroom, NP       And   diphenhydrAMINE (BENADRYL) injection 50 mg  50 mg Intramuscular TID PRN Eligha Bridegroom, NP       And   LORazepam (ATIVAN) injection 2 mg  2 mg Intramuscular TID PRN Eligha Bridegroom, NP       haloperidol lactate (HALDOL) injection 10 mg  10 mg Intramuscular TID PRN Eligha Bridegroom, NP       And   diphenhydrAMINE (BENADRYL) injection 50 mg  50 mg Intramuscular TID PRN Eligha Bridegroom, NP       And   LORazepam (ATIVAN) injection 2 mg  2 mg Intramuscular TID PRN Eligha Bridegroom, NP       hydrOXYzine (ATARAX) tablet 25 mg  25 mg Oral TID PRN Eligha Bridegroom, NP   25 mg at 01/19/23 2110   magnesium hydroxide (MILK OF MAGNESIA) suspension 30 mL  30 mL Oral Daily PRN Eligha Bridegroom, NP       multivitamin with minerals tablet 1 tablet  1 tablet Oral Daily Eligha Bridegroom, NP   1 tablet at 01/20/23 0751   sertraline (ZOLOFT) tablet 100 mg  100 mg Oral Daily Eliseo Gum B, MD   100 mg at 01/20/23 0751   thiamine (Vitamin B-1) tablet 100 mg  100 mg Oral Daily Eligha Bridegroom, NP   100 mg at 01/20/23 0751   traZODone (DESYREL) tablet 50 mg  50 mg Oral QHS PRN Eligha Bridegroom, NP   50 mg at 01/19/23 2110   Vitamin D (Ergocalciferol) (DRISDOL) 1.25 MG (50000 UNIT) capsule 50,000 Units  50,000 Units Oral Q7 days Golda Acre, MD   50,000 Units at 01/16/23 1054   vitamin D3 (CHOLECALCIFEROL) tablet 2,000 Units  2,000 Units Oral Daily Golda Acre, MD    2,000 Units at 01/20/23 0751    Lab Results:  No results found for this or any previous visit (from the past 48 hours).   Blood Alcohol level:  Lab Results  Component Value Date   ETH 147 (H) 01/13/2023   ETH 20 (H) 01/09/2023    Metabolic Disorder Labs: Lab Results  Component Value Date   HGBA1C 6.0 (H) 03/06/2013   MPG 126 (  H) 03/06/2013   No results found for: "PROLACTIN" Lab Results  Component Value Date   CHOL 136 03/06/2013   TRIG 93 03/06/2013   HDL 36 (L) 03/06/2013   CHOLHDL 3.8 03/06/2013   VLDL 19 03/06/2013   LDLCALC 81 03/06/2013    Physical Findings: AIMS:  , ,  ,  ,    CIWA:  CIWA-Ar Total: 1 COWS:     Psychiatric Specialty Exam:  Presentation  General Appearance: Appropriate for Environment  Eye Contact: Fair  Speech: Clear and Coherent  Speech Volume: Decreased  Handedness: Right   Mood and Affect  Mood: Anxious; Depressed  Affect: Blunt; Restricted   Thought Process  Thought Processes: Coherent  Descriptions of Associations: Intact  Orientation: Full (Time, Place and Person)  Thought Content: Logical  History of Schizophrenia/Schizoaffective disorder: No data recorded Duration of Psychotic Symptoms: NA Hallucinations: Hallucinations: None  Ideas of Reference: None  Suicidal Thoughts: Suicidal Thoughts: No  Homicidal Thoughts: Homicidal Thoughts: No   Sensorium  Memory: Immediate Fair; Remote Fair; Recent Fair  Judgment: Fair  Insight: Fair   Art therapist  Concentration: Fair  Attention Span: Fair  Recall: Fair  Fund of Knowledge: Fair  Language: Good   Psychomotor Activity  Psychomotor Activity: Psychomotor Activity: Normal   Assets  Assets: Desire for Improvement; Communication Skills   Sleep  Sleep: Sleep: Fair Number of Hours of Sleep: 6.5   Musculoskeletal: Strength & Muscle Tone: within normal limits Gait & Station: normal Patient leans: N/A   Physical Exam: General:  Sitting comfortably. NAD. HEENT: Normocephalic, atraumatic, MMM, EMOI Lungs: no increased work of breathing noted Heart: no cyanosis Abdomen: Non distended Musculoskeletal: FROM. No obvious deformities Skin: Warm, dry, intact. No rashes noted Neuro: No obvious focal deficits.  Gait and station are normal  Review of Systems  Constitutional: Negative.   HENT: Negative.    Eyes: Negative.   Respiratory: Negative.    Cardiovascular: Negative.   Gastrointestinal: Negative.   Genitourinary: Negative.   Skin: Negative.   Neurological: Negative.      Blood pressure 118/68, pulse 84, temperature 98.9 F (37.2 C), temperature source Oral, resp. rate 20, height 5\' 7"  (1.702 m), weight 63.6 kg, SpO2 100%. Body mass index is 21.96 kg/m.  ASSESSMENT: Seth Jordan is an 52 y.o. male who  has a past medical history of B12 deficiency, Bipolar disease, chronic (HCC), Schizo affective schizophrenia (HCC), and Vitamin D deficiency.  He presented on 01/14/2023  2:17 PM for Substance induced mood disorder (HCC).    Will increase Zoloft tom to address continued depression symptoms and anxiety, will continue titration up to a therapeutic dose of Zoloft. At this time patient would not likely be able to maintain compliance with Naltrexone. Pharmacy agreed that patient would likely have to pay out of pocket for naltrexone.   Diagnoses / Active Problems: Patient Active Problem List   Diagnosis Date Noted   Alcohol use disorder, severe, dependence (HCC) 01/15/2023   Alcohol withdrawal (HCC) 01/15/2023   Substance induced mood disorder (HCC) 01/14/2023   Suicidal ideations 01/13/2023   MDD (major depressive disorder), single episode, severe (HCC) 01/13/2023   Chest pain 03/14/2013   Right bundle branch block (RBBB) on electrocardiogram (ECG) 03/06/2013   Musculoskeletal chest pain 03/05/2013      PLAN: Safety and Monitoring:  -- Voluntary admission to inpatient psychiatric unit for safety,  stabilization and treatment  -- Daily contact with patient to assess and evaluate symptoms and progress in treatment  -- Patient's  case to be discussed in multi-disciplinary team meeting  -- Observation Level : q15 minute checks  -- Vital signs:  q12 hours  -- Precautions: suicide, elopement, and assault  2. Psychiatric Diagnoses and Treatment:   --  Patient Active Problem List   Diagnosis Date Noted   Alcohol use disorder, severe, dependence (HCC) 01/15/2023   Alcohol withdrawal (HCC) 01/15/2023   Substance induced mood disorder (HCC) 01/14/2023   Suicidal ideations 01/13/2023   MDD (major depressive disorder), single episode, severe (HCC) 01/13/2023   Chest pain 03/14/2013   Right bundle branch block (RBBB) on electrocardiogram (ECG) 03/06/2013   Musculoskeletal chest pain 03/05/2013    -Increase Sertraline to 100 mg daily for mood -B12 1000 mcg daily for B12 deficiency -Ergocalciferol 50,000 units weekly and 2000 units daily for vitamin D deficiency -Multivitamin for alcohol use disorder; discussed naltrexone but patient denies cravings and is emphatic that he has done drinking after this admission also unable to afford   vitamin B-12  1,000 mcg Oral Daily   multivitamin with minerals  1 tablet Oral Daily   sertraline  100 mg Oral Daily   thiamine  100 mg Oral Daily   Vitamin D (Ergocalciferol)  50,000 Units Oral Q7 days   cholecalciferol  2,000 Units Oral Daily      3. Medical Issues Being Addressed:   -- NA  Labs reviewed, unremarkable with the exception of: Vitamin D deficiency of 11.68, B12 deficiency of 153     4. Discharge Planning:   -- Social work and case management to assist with discharge planning and identification of hospital follow-up needs prior to discharge  -- Estimated LOS: Expected discharge on 12/17  -- Discharge Concerns: Need to establish a safety plan; Medication compliance and effectiveness  -- Discharge Goals: Return home with outpatient  referrals for mental health follow-up including medication management/psychotherapy  5. Short Term Goals:  Improve ability to identify changes in lifestyle to reduce recurrence of condition, verbalize feelings, disclose and discuss suicidal ideas, demonstrate self-control, identify and develop effective coping behaviors, compliance with prescribed medications, identify triggers associated with substance abuse/mental health issues, participate in unit milieu and in scheduled group therapies   6. Long Term Goals: Improvement in symptoms so the patient is ready for discharge   --The risks/benefits/side-effects/alternatives to the medications above were discussed in detail with the patient and time was given for questions. The patient provided informed consent.   -- Metabolic profile and EKG monitoring obtained while on an atypical antipsychotic and listed in the EHR    Total Time Spent in Direct Patient Care:  I personally spent 30 minutes on the unit in direct patient care. The direct patient care time included face-to-face time with the patient, reviewing the patient's chart, communicating with other professionals, and coordinating care. Greater than 50% of this time was spent in counseling or coordinating care with the patient regarding goals of hospitalization, psycho-education, and discharge planning needs.   Stormee Duda [facility] Psychiatrist    Portions of this note were created using voice recognition software. Minor syntax errors, grammatical content, spelling, or punctuation errors may have occurred unintentionally. Please notify the There[facility] Parkin if the meaning of any statement is unclear.  Patient ID: Seth Jordan, male   DOB: 1970-09-28, 52 y.o.   MRN: [id number]

## 2023-01-20 NOTE — BH Specialist Note (Signed)
LCSW faxed H&P to [facility] (fax number [phone]) for possible admission  Nolon Rod, LCSW

## 2023-01-20 NOTE — Plan of Care (Signed)

## 2023-01-20 NOTE — [facility] Group Notes (Signed)
[facility] Group Notes:  (Nursing/MHT/Case Management/Adjunct)  Date:  01/20/2023  Time:  2000  Type of Therapy:   wrap up group  Participation Level:  Active  Participation Quality:  Appropriate, Attentive, Sharing, and Supportive  Affect:  Appropriate  Cognitive:  Alert and Appropriate  Insight:  Appropriate  Engagement in Group:  Engaged  Modes of Intervention:  Discussion, Education, and Support  Summary of Progress/Problems: Pt. Rate day a 10. Pt. Waiting on housing. Pt. Stated he did achieve his goal today.  Fay Records 01/20/2023, 8:42 PM

## 2023-01-20 NOTE — Progress Notes (Signed)
   01/20/23 2030  Psych Admission Type (Psych Patients Only)  Admission Status Voluntary  Psychosocial Assessment  Patient Complaints None  Eye Contact Fair  Facial Expression Anxious  Affect Appropriate to circumstance  Speech Logical/coherent  Interaction Assertive  Motor Activity Slow  Appearance/Hygiene Unremarkable  Behavior Characteristics Cooperative  Mood Depressed  Thought Process  Coherency WDL  Content WDL  Delusions WDL  Perception WDL  Hallucination None reported or observed  Judgment Impaired  Confusion WDL  Danger to Self  Current suicidal ideation? Denies  Danger to Others  Danger to Others None reported or observed

## 2023-01-20 NOTE — Group Note (Signed)
LCSW Group Therapy Note  Group Date: 01/20/2023 Start Time: 1100 End Time: 1200   Type of Therapy and Topic:  Group Therapy - Healthy vs Unhealthy Coping Skills  Participation Level:  None   Description of Group The focus of this group was to determine what unhealthy coping techniques typically are used by group members and what healthy coping techniques would be helpful in coping with various problems. Patients were guided in becoming aware of the differences between healthy and unhealthy coping techniques. Patients were asked to identify 2-3 healthy coping skills they would like to learn to use more effectively.  Therapeutic Goals Patients learned that coping is what human beings do all day long to deal with various situations in their lives Patients defined and discussed healthy vs unhealthy coping techniques Patients identified their preferred coping techniques and identified whether these were healthy or unhealthy Patients determined 2-3 healthy coping skills they would like to become more familiar with and use more often. Patients provided support and ideas to each other   Summary of Patient Progress:  During group, Seth Jordan was present but did not express any thoughts or concerns. Patient proved open to input from peers and feedback from CSW. Patient demonstrated no insight into the subject matter, was respectful of peers, and participated throughout the entire session.   Therapeutic Modalities Cognitive Behavioral Therapy Motivational Interviewing  Marinda Elk, [place] 01/20/2023  1:24 PM

## 2023-01-20 NOTE — Group Note (Signed)
Recreation Therapy Group Note   Group Topic:Animal Assisted Therapy   Group Date: 01/20/2023 Start Time: 0950 End Time: 1030 Facilitators: Evans Levee-McCall, LRT,CTRS Location: [address]   Animal-Assisted Activity (AAA) Program Checklist/Progress Notes Patient Eligibility Criteria Checklist & Daily Group note for Rec Tx Intervention  AAA/T Program Assumption of Risk Form signed by Patient/ or Parent Legal Guardian Yes  Patient is free of allergies or severe asthma Yes  Patient reports no fear of animals Yes  Patient reports no history of cruelty to animals Yes  Patient understands his/her participation is voluntary Yes  Patient washes hands before animal contact Yes  Patient washes hands after animal contact Yes  Education: Hand Washing, Appropriate Animal Interaction   Education Outcome: Acknowledges education.    Affect/Mood: Appropriate   Participation Level: Engaged   Participation Quality: Independent   Behavior: Appropriate   Speech/Thought Process: Focused   Insight: Good   Judgement: Good   Modes of Intervention: Teaching laboratory technician   Patient Response to Interventions:  Engaged   Education Outcome:  In group clarification offered    Clinical Observations/Individualized Feedback: Patient attended session and interacted appropriately with therapy dog and peers. Patient asked appropriate questions about therapy dog and his training. Patient shared stories about their pets at home with group.     Plan: Continue to engage patient in RT group sessions 2-3x/week.   Fanta Wimberley-McCall, LRT,CTRS 01/20/2023 1:18 PM

## 2023-01-21 DIAGNOSIS — F1994 Other psychoactive substance use, unspecified with psychoactive substance-induced mood disorder: Secondary | ICD-10-CM | POA: Diagnosis not present

## 2023-01-21 NOTE — Plan of Care (Signed)

## 2023-01-21 NOTE — [facility] Group Notes (Signed)
[facility] Group Notes:  (Nursing/MHT/Case Management/Adjunct)  Date:  01/21/2023  Time:  9:17 PM  Type of Therapy:  Psychoeducational Skills  Participation Level:  Active  Participation Quality:  Appropriate  Affect:  Appropriate  Cognitive:  Appropriate  Insight:  Good  Engagement in Group:  Engaged  Modes of Intervention:  Education  Summary of Progress/Problems: The patient attended the evening N.A. group and was appropriate.   Hazle Coca S 01/21/2023, 9:17 PM

## 2023-01-21 NOTE — Progress Notes (Signed)
Patient ID: Seth Jordan, male   DOB: 1970-03-12, 52 y.o.   MRN: [id number]   LCSW completed admission info for acceptance into [facility].  Application is still pending  Nolon Rod, LCSW

## 2023-01-21 NOTE — Plan of Care (Signed)
  Problem: Education: Goal: Emotional status will improve Outcome: Progressing Goal: Mental status will improve Outcome: Progressing   Problem: Activity: Goal: Interest or engagement in activities will improve Outcome: Progressing   Problem: Coping: Goal: Ability to verbalize frustrations and anger appropriately will improve Outcome: Progressing Goal: Ability to demonstrate self-control will improve Outcome: Progressing   

## 2023-01-21 NOTE — Progress Notes (Signed)
   01/21/23 2108  Psych Admission Type (Psych Patients Only)  Admission Status Voluntary  Psychosocial Assessment  Patient Complaints None  Eye Contact Fair  Facial Expression Animated  Affect Anxious  Speech Logical/coherent  Interaction Assertive  Motor Activity Other (Comment) (WDL)  Appearance/Hygiene Unremarkable  Behavior Characteristics Cooperative;Appropriate to situation  Mood Depressed  Thought Process  Coherency WDL  Content WDL  Delusions None reported or observed  Perception WDL  Hallucination None reported or observed  Judgment Impaired  Confusion None  Danger to Self  Current suicidal ideation? Denies  Danger to Others  Danger to Others None reported or observed

## 2023-01-21 NOTE — Group Note (Signed)
Recreation Therapy Group Note   Group Topic:Other  Group Date: 01/21/2023 Start Time: 1400 End Time: 1435 Facilitators: Darien Mignogna-McCall, LRT,CTRS Location: [address]   Activity Description/Intervention: Therapeutic Drumming. Patients with peers and staff were given the opportunity to engage in a leader facilitated [facility] Empowerment Drumming Circle with staff from the [employer], in partnership with [employer]. Teaching laboratory technician and trained [employer], Theodoro Doing leading with LRT observing and documenting intervention and pt response. This evidenced-based practice targets 7 areas of health and wellbeing in the human experience including: stress-reduction, exercise, self-expression, camaraderie/support, nurturing, spirituality, and music-making (leisure).   Goal Area(s) Addresses:  Patient will engage in pro-social way in music group.  Patient will follow directions of drum leader on the first prompt. Patient will demonstrate no behavioral issues during group.  Patient will identify if a reduction in stress level occurs as a result of participation in therapeutic drum circle.    Education: Leisure exposure, Pharmacologist, Musical expression, Discharge Planning   Affect/Mood: Appropriate   Participation Level: Engaged   Participation Quality: Independent   Behavior: Appropriate   Speech/Thought Process: Focused   Insight: Good   Judgement: Good   Modes of Intervention: Teaching laboratory technician   Patient Response to Interventions:  Engaged   Education Outcome:  Acknowledges education   Clinical Observations/Individualized Feedback: Seth Jordan actively engaged in therapeutic drumming exercise and discussions. Pt was appropriate with peers, staff, and musical equipment for duration of programming.  Pt identified "alright" as their feeling after participation in music-based programming.     Plan: Continue to engage patient in RT  group sessions 2-3x/week.   Seth Jordan, LRT,CTRS 01/21/2023 3:10 PM

## 2023-01-21 NOTE — Group Note (Signed)
Date:  01/21/2023 Time:  10:10 AM  Group Topic/Focus:  Developing a Wellness Toolbox:   The focus of this group is to help patients develop a "wellness toolbox" with skills and strategies to promote recovery upon discharge. Goals Group:   The focus of this group is to help patients establish daily goals to achieve during treatment and discuss how the patient can incorporate goal setting into their daily lives to aide in recovery.    Participation Level:  Active  Participation Quality:  Appropriate  Affect:  Appropriate  Cognitive:  Appropriate  Insight: Appropriate  Engagement in Group:  Improving  Modes of Intervention:  Discussion  Additional Comments:  pt attended group  Seth Jordan E [name] 01/21/2023, 10:10 AM

## 2023-01-21 NOTE — Progress Notes (Signed)
   01/21/23 1000  Psych Admission Type (Psych Patients Only)  Admission Status Voluntary  Psychosocial Assessment  Patient Complaints None  Eye Contact Fair  Facial Expression Animated  Affect Appropriate to circumstance  Speech Logical/coherent  Interaction Assertive  Motor Activity Other (Comment) (wnl)  Appearance/Hygiene Unremarkable  Behavior Characteristics Cooperative  Mood Depressed  Thought Process  Coherency WDL  Content WDL  Delusions WDL  Perception WDL  Hallucination None reported or observed  Judgment Impaired  Confusion None  Danger to Self  Current suicidal ideation? Denies  Danger to Others  Danger to Others None reported or observed

## 2023-01-21 NOTE — Progress Notes (Signed)
[facility] MD Progress Note  01/21/2023 10:01 AM Seth Jordan  MRN:  [id number] Principal Problem: Substance induced mood disorder (HCC) Diagnosis: Principal Problem:   Substance induced mood disorder (HCC) Active Problems:   Right bundle branch block (RBBB) on electrocardiogram (ECG)   Alcohol use disorder, severe, dependence (HCC)   Alcohol withdrawal (HCC)   ID & Admission Information: Seth Jordan is an 52 y.o. male who  has a past medical history of B12 deficiency, Bipolar disease, chronic (HCC), Schizo affective schizophrenia (HCC), and Vitamin D deficiency.  He presented on 01/14/2023  2:17 PM for Substance induced mood disorder (HCC).   Chief Complaint:" I am doing better and I think I got into the program"  Subjective:   The patient was seen today and the chart was reviewed.  Staff reports no behavioral problems.  He has been sleeping well although has some intermittent awakening.  He has been attending groups.  He received hydroxyzine and trazodone as a as needed.  On assessment today the patient reports that he is doing better and is feeling more positive about the outcome of his phone calls to the different programs.  He thinks he may have been accepted and he will know something today.  He would like to go to to the [facility] program if possible.  He denies any cravings and denies any side effects.  He is contracting for safety.  The plan is for discharge tomorrow and hopefully he will be accepted there to one of the programs.   Past Psychiatric and Medical Medical History:  Past Medical History:  Diagnosis Date   B12 deficiency    Bipolar disease, chronic (HCC)    Schizo affective schizophrenia (HCC)    Vitamin D deficiency     History reviewed. No pertinent surgical history.  --Denies any past psychiatric diagnoses. Per chart review, previous diagnoses of schizoaffective and bipolar --Denies past psychotropic medications --Endorses hx of one suicide attempt ~3 years ago when  he tried to OD on Advil. --Denies ever being in a psychiatric hospital or seeing a outpatient psychiatrist  Family History(Medical and Psychiatric): History reviewed. No pertinent family history. Denies    Social History:  Social History   Substance and Sexual Activity  Alcohol Use Yes     Social History   Substance and Sexual Activity  Drug Use No    Social History   Socioeconomic History   Marital status: Single    Spouse name: Not on file   Number of children: Not on file   Years of education: Not on file   Highest education level: Not on file  Occupational History   Not on file  Tobacco Use   Smoking status: Never    Passive exposure: Current   Smokeless tobacco: Never  Vaping Use   Vaping status: Never Used  Substance and Sexual Activity   Alcohol use: Yes   Drug use: No   Sexual activity: Not Currently  Other Topics Concern   Not on file  Social History Narrative   Not on file   Social Drivers of Health   Financial Resource Strain: Not on file  Food Insecurity: Food Insecurity Present (01/14/2023)   Hunger Vital Sign    Worried About Running Out of Food in the Last Year: Sometimes true    Ran Out of Food in the Last Year: Sometimes true  Transportation Needs: Unmet Transportation Needs (01/14/2023)   PRAPARE - Administrator, Civil Service (Medical): Yes    Lack  of Transportation (Non-Medical): Yes  Physical Activity: Not on file  Stress: Not on file  Social Connections: Unknown (11/12/2022)   Received from [employer]   Social Network    Social Network: Not on file        Current Medications: Current Facility-Administered Medications  Medication Dose Route Frequency Provider Last Rate Last Admin   acetaminophen (TYLENOL) tablet 650 mg  650 mg Oral Q6H PRN Eligha Bridegroom, NP       alum & mag hydroxide-simeth (MAALOX/MYLANTA) 200-200-20 MG/5ML suspension 30 mL  30 mL Oral Q4H PRN Eligha Bridegroom, NP       cyanocobalamin  (VITAMIN B12) tablet 1,000 mcg  1,000 mcg Oral Daily Golda Acre, MD   1,000 mcg at 01/21/23 0756   haloperidol (HALDOL) tablet 5 mg  5 mg Oral TID PRN Eligha Bridegroom, NP       And   diphenhydrAMINE (BENADRYL) capsule 50 mg  50 mg Oral TID PRN Eligha Bridegroom, NP       haloperidol lactate (HALDOL) injection 5 mg  5 mg Intramuscular TID PRN Eligha Bridegroom, NP       And   diphenhydrAMINE (BENADRYL) injection 50 mg  50 mg Intramuscular TID PRN Eligha Bridegroom, NP       And   LORazepam (ATIVAN) injection 2 mg  2 mg Intramuscular TID PRN Eligha Bridegroom, NP       haloperidol lactate (HALDOL) injection 10 mg  10 mg Intramuscular TID PRN Eligha Bridegroom, NP       And   diphenhydrAMINE (BENADRYL) injection 50 mg  50 mg Intramuscular TID PRN Eligha Bridegroom, NP       And   LORazepam (ATIVAN) injection 2 mg  2 mg Intramuscular TID PRN Eligha Bridegroom, NP       hydrOXYzine (ATARAX) tablet 25 mg  25 mg Oral TID PRN Eligha Bridegroom, NP   25 mg at 01/20/23 2104   magnesium hydroxide (MILK OF MAGNESIA) suspension 30 mL  30 mL Oral Daily PRN Eligha Bridegroom, NP       multivitamin with minerals tablet 1 tablet  1 tablet Oral Daily Eligha Bridegroom, NP   1 tablet at 01/21/23 0756   sertraline (ZOLOFT) tablet 100 mg  100 mg Oral Daily Eliseo Gum B, MD   100 mg at 01/21/23 0756   thiamine (Vitamin B-1) tablet 100 mg  100 mg Oral Daily Eligha Bridegroom, NP   100 mg at 01/21/23 0756   traZODone (DESYREL) tablet 50 mg  50 mg Oral QHS PRN Eligha Bridegroom, NP   50 mg at 01/20/23 2104   Vitamin D (Ergocalciferol) (DRISDOL) 1.25 MG (50000 UNIT) capsule 50,000 Units  50,000 Units Oral Q7 days Golda Acre, MD   50,000 Units at 01/16/23 1054   vitamin D3 (CHOLECALCIFEROL) tablet 2,000 Units  2,000 Units Oral Daily Golda Acre, MD   2,000 Units at 01/21/23 0756    Lab Results:  No results found for this or any previous visit (from the past 48 hours).   Blood Alcohol level:  Lab Results   Component Value Date   ETH 147 (H) 01/13/2023   ETH 20 (H) 01/09/2023    Metabolic Disorder Labs: Lab Results  Component Value Date   HGBA1C 6.0 (H) 03/06/2013   MPG 126 (H) 03/06/2013   No results found for: "PROLACTIN" Lab Results  Component Value Date   CHOL 136 03/06/2013   TRIG 93 03/06/2013   HDL 36 (L) 03/06/2013   CHOLHDL  3.8 03/06/2013   VLDL 19 03/06/2013   LDLCALC 81 03/06/2013    Physical Findings: AIMS:  , ,  ,  ,    CIWA:  CIWA-Ar Total: 1 COWS:     Psychiatric Specialty Exam:  Presentation  General Appearance: Casual  Eye Contact: Fair  Speech: Clear and Coherent  Speech Volume: Normal  Handedness: Right   Mood and Affect  Mood: Anxious  Affect: Appropriate   Thought Process  Thought Processes: Coherent  Descriptions of Associations: Intact  Orientation: Full (Time, Place and Person)  Thought Content: Logical  History of Schizophrenia/Schizoaffective disorder: No data recorded Duration of Psychotic Symptoms: NA Hallucinations: Hallucinations: None  Ideas of Reference: None  Suicidal Thoughts: Suicidal Thoughts: No  Homicidal Thoughts: Homicidal Thoughts: No   Sensorium  Memory: Immediate Fair; Remote Fair; Recent Fair  Judgment: Fair  Insight: Fair   Art therapist  Concentration: Fair  Attention Span: Fair  Recall: [employer] of Knowledge: Fair  Language: Fair   Psychomotor Activity  Psychomotor Activity: Psychomotor Activity: Normal   Assets  Assets: Communication Skills; Desire for Improvement   Sleep  Sleep: Sleep: Fair   Musculoskeletal: Strength & Muscle Tone: within normal limits Gait & Station: normal Patient leans: N/A   Physical Exam: General: Sitting comfortably. NAD. HEENT: Normocephalic, atraumatic, MMM, EMOI Lungs: no increased work of breathing noted Heart: no cyanosis Abdomen: Non distended Musculoskeletal: FROM. No obvious deformities Skin: Warm, dry, intact. No  rashes noted Neuro: No obvious focal deficits.  Gait and station are normal  Review of Systems  Constitutional: Negative.   HENT: Negative.    Eyes: Negative.   Respiratory: Negative.    Cardiovascular: Negative.   Gastrointestinal: Negative.   Genitourinary: Negative.   Skin: Negative.   Neurological: Negative.      Blood pressure 107/80, pulse 77, temperature 98.7 F (37.1 C), temperature source Oral, resp. rate 20, height 5\' 7"  (1.702 m), weight 63.6 kg, SpO2 100%. Body mass index is 21.96 kg/m.  ASSESSMENT: Seth Jordan is an 52 y.o. male who  has a past medical history of B12 deficiency, Bipolar disease, chronic (HCC), Schizo affective schizophrenia (HCC), and Vitamin D deficiency.  He presented on 01/14/2023  2:17 PM for Substance induced mood disorder (HCC).    Will increase Zoloft tom to address continued depression symptoms and anxiety, will continue titration up to a therapeutic dose of Zoloft. At this time patient would not likely be able to maintain compliance with Naltrexone. Pharmacy agreed that patient would likely have to pay out of pocket for naltrexone.   Diagnoses / Active Problems: Patient Active Problem List   Diagnosis Date Noted   Alcohol use disorder, severe, dependence (HCC) 01/15/2023   Alcohol withdrawal (HCC) 01/15/2023   Substance induced mood disorder (HCC) 01/14/2023   Suicidal ideations 01/13/2023   MDD (major depressive disorder), single episode, severe (HCC) 01/13/2023   Chest pain 03/14/2013   Right bundle branch block (RBBB) on electrocardiogram (ECG) 03/06/2013   Musculoskeletal chest pain 03/05/2013      PLAN: Safety and Monitoring:  -- Voluntary admission to inpatient psychiatric unit for safety, stabilization and treatment  -- Daily contact with patient to assess and evaluate symptoms and progress in treatment  -- Patient's case to be discussed in multi-disciplinary team meeting  -- Observation Level : q15 minute checks  -- Vital  signs:  q12 hours  -- Precautions: suicide, elopement, and assault  2. Psychiatric Diagnoses and Treatment:   --  Patient Active Problem List  Diagnosis Date Noted   Alcohol use disorder, severe, dependence (HCC) 01/15/2023   Alcohol withdrawal (HCC) 01/15/2023   Substance induced mood disorder (HCC) 01/14/2023   Suicidal ideations 01/13/2023   MDD (major depressive disorder), single episode, severe (HCC) 01/13/2023   Chest pain 03/14/2013   Right bundle branch block (RBBB) on electrocardiogram (ECG) 03/06/2013   Musculoskeletal chest pain 03/05/2013    -I continue sertraline to 100 mg daily for mood -B12 1000 mcg daily for B12 deficiency -Ergocalciferol 50,000 units weekly and 2000 units daily for vitamin D deficiency -Multivitamin for alcohol use disorder; discussed naltrexone but patient denies cravings and is emphatic that he has done drinking after this admission also unable to afford   vitamin B-12  1,000 mcg Oral Daily   multivitamin with minerals  1 tablet Oral Daily   sertraline  100 mg Oral Daily   thiamine  100 mg Oral Daily   Vitamin D (Ergocalciferol)  50,000 Units Oral Q7 days   cholecalciferol  2,000 Units Oral Daily      3. Medical Issues Being Addressed:   -- NA  Labs reviewed, unremarkable with the exception of: Vitamin D deficiency of 11.68, B12 deficiency of 153     4. Discharge Planning:   -- Social work and case management to assist with discharge planning and identification of hospital follow-up needs prior to discharge  -- Estimated LOS: Discharge on 01/22/2023 to [facility] program.  -- Discharge Concerns: Need to establish a safety plan; Medication compliance and effectiveness  -- Discharge Goals: Return home with outpatient referrals for mental health follow-up including medication management/psychotherapy  5. Short Term Goals:  Improve ability to identify changes in lifestyle to reduce recurrence of condition, verbalize feelings, disclose and  discuss suicidal ideas, demonstrate self-control, identify and develop effective coping behaviors, compliance with prescribed medications, identify triggers associated with substance abuse/mental health issues, participate in unit milieu and in scheduled group therapies   6. Long Term Goals: Improvement in symptoms so the patient is ready for discharge   --The risks/benefits/side-effects/alternatives to the medications above were discussed in detail with the patient and time was given for questions. The patient provided informed consent.   -- Metabolic profile and EKG monitoring obtained while on an atypical antipsychotic and listed in the EHR    Total Time Spent in Direct Patient Care:  I personally spent 30 minutes on the unit in direct patient care. The direct patient care time included face-to-face time with the patient, reviewing the patient's chart, communicating with other professionals, and coordinating care. Greater than 50% of this time was spent in counseling or coordinating care with the patient regarding goals of hospitalization, psycho-education, and discharge planning needs.   Seth Jordan [facility] Psychiatrist    Portions of this note were created using voice recognition software. Minor syntax errors, grammatical content, spelling, or punctuation errors may have occurred unintentionally. Please notify the Thereasa Parkin if the meaning of any statement is unclear.  Patient ID: Seth Jordan, male   DOB: Jun 18, 1970, 52 y.o.   MRN: [id number] Patient ID: Seth Jordan, male   DOB: [birth year], 52 y.o.   MRN: [id number]

## 2023-01-21 NOTE — [facility] Group Notes (Signed)
Spiritual care group facilitated by Chaplain Dyanne Carrel, BCC and Arlyce Dice, Mdiv  Group focused on topic of strength. Group members reflected on what thoughts and feelings emerge when they hear this topic. They then engaged in facilitated dialog around how strength is present in their lives. This dialog focused on representing what strength had been to them in their lives (images and patterns given) and what they saw as helpful in their life now (what they needed / wanted).  Activity drew on narrative framework.  Patient Progress: Seth Jordan attended group and actively engaged and participated in group conversation and activities.  Comments demonstrated good insight and contributed positively to the group conversation.

## 2023-01-22 DIAGNOSIS — F1994 Other psychoactive substance use, unspecified with psychoactive substance-induced mood disorder: Secondary | ICD-10-CM

## 2023-01-22 MED ORDER — VITAMIN D (ERGOCALCIFEROL) 1.25 MG (50000 UNIT) PO CAPS: 50000.0000 [IU] | ORAL_CAPSULE | ORAL | 0 refills | Status: AC

## 2023-01-22 MED ORDER — HYDROXYZINE HCL 25 MG PO TABS: 25.0000 mg | ORAL_TABLET | Freq: Three times a day (TID) | ORAL | 0 refills | Status: AC | PRN

## 2023-01-22 MED ORDER — VITAMIN B-1 100 MG PO TABS: 100.0000 mg | ORAL_TABLET | Freq: Every day | ORAL | 0 refills | Status: AC

## 2023-01-22 MED ORDER — TRAZODONE HCL 50 MG PO TABS: 50.0000 mg | ORAL_TABLET | Freq: Every evening | ORAL | 0 refills | Status: AC | PRN

## 2023-01-22 MED ORDER — ADULT MULTIVITAMIN W/MINERALS CH: 1.0000 | ORAL_TABLET | Freq: Every day | ORAL | 0 refills | Status: AC

## 2023-01-22 MED ORDER — CYANOCOBALAMIN 1000 MCG PO TABS: 1000.0000 ug | ORAL_TABLET | Freq: Every day | ORAL | 0 refills | Status: AC

## 2023-01-22 MED ORDER — SERTRALINE HCL 100 MG PO TABS: 100.0000 mg | ORAL_TABLET | Freq: Every day | ORAL | 0 refills | Status: AC

## 2023-01-22 NOTE — Transportation (Signed)
01/22/2023  Seth Jordan DOB: 11-30-1970 MRN: [id number]   RIDER WAIVER AND RELEASE OF LIABILITY  For the purposes of helping with transportation needs, [facility] partners with outside transportation providers (taxi companies, [place], Catering manager.) to give [employer] patients or other approved people the choice of on-demand rides [employer]") to our buildings for non-emergency visits.  By using [employer], I, the person signing this document, on behalf of myself and/or any legal minors (in my care using the [employer]), agree:  Science writer given to me are supplied by independent, outside transportation providers who do not work for, or have any affiliation with, [employer]. [facility] is not a transportation company. [facility] has no control over the quality or safety of the rides I get using [employer]. [facility] has no control over whether any outside ride will happen on time or not. [facility] gives no guarantee on the reliability, quality, safety, or availability on any rides, or that no mistakes will happen. I know and accept that traveling by vehicle (car, truck, [employer], Zenaida Niece, bus, taxi, etc.) has risks of serious injuries such as disability, being paralyzed, and death. I know and agree the risk of using [employer] is mine alone, and not [employer]. Transport Services are provided "as is" and as are available. The transportation providers are in charge for all inspections and care of the vehicles used to provide these rides. I agree not to take legal action against [facility], its agents, employees, officers, directors, representatives, insurers, attorneys, assigns, successors, subsidiaries, and affiliates at any time for any reasons related directly or indirectly to using [employer]. I also agree not to take legal action against [facility] or its affiliates for any injury, death, or damage to property caused by or related to using  [employer]. I have read this Waiver and Release of Liability, and I understand the terms used in it and their legal meaning. This Waiver is freely and voluntarily given with the understanding that my right (or any legal minors) to legal action against [place] relating to [employer] is knowingly given up to use these services.   I attest that I read the Ride Waiver and Release of Liability to Seth Jordan, gave Mr. Jeske the opportunity to ask questions and answered the questions asked (if any). I affirm that Seth Jordan then provided consent for assistance with transportation.

## 2023-01-22 NOTE — Progress Notes (Signed)
Pt given back all his belongings from locker 40 along with medications, prescriptions, MAR, AVS and follow up instructions.  Pt denies SI, HI or AVH.  Pt escorted to lobby and left via taxi to [place].  Cab voucher given to [employer] driver.  No incidents noted.

## 2023-01-22 NOTE — Discharge Summary (Signed)
Physician Discharge Summary Note  Patient:  Seth Jordan is an 52 y.o., male MRN:  [id number] DOB:  Feb 10, 1970 Patient phone:  [phone] (home)  Patient address:   [address] [place] [address],  Total Time spent with patient: 20 minutes  Date of Admission:  01/14/2023 Date of Discharge: 01/22/2023  Reason for Admission:  Seth Jordan is an 52 y.o. male who has a past medical history of B12 deficiency, Bipolar disease, chronic (HCC), Schizo affective schizophrenia (HCC), and Vitamin D deficiency. He presented on 01/14/2023 2:17 PM for Substance induced mood disorder (HCC).   Principal Problem: Substance induced mood disorder (HCC) Discharge Diagnoses: Principal Problem:   Substance induced mood disorder (HCC) Active Problems:   Right bundle branch block (RBBB) on electrocardiogram (ECG)   Alcohol use disorder, severe, dependence (HCC)   Alcohol withdrawal (HCC)   Past Psychiatric History:  --Denies any past psychiatric diagnoses. Per chart review, previous diagnoses of schizoaffective and bipolar --Denies past psychotropic medications --Endorses hx of one suicide attempt ~3 years ago when he tried to OD on Advil. --Denies ever being in a psychiatric hospital or seeing a outpatient psychiatrist  Past Medical History:  Past Medical History:  Diagnosis Date   B12 deficiency    Bipolar disease, chronic (HCC)    Schizo affective schizophrenia (HCC)    Vitamin D deficiency    History reviewed. No pertinent surgical history. Family History: History reviewed. No pertinent family history. Family Psychiatric  History: denies Social History:  Social History   Substance and Sexual Activity  Alcohol Use Yes     Social History   Substance and Sexual Activity  Drug Use No    Social History   Socioeconomic History   Marital status: Single    Spouse name: Not on file   Number of children: Not on file   Years of education: Not on file   Highest  education level: Not on file  Occupational History   Not on file  Tobacco Use   Smoking status: Never    Passive exposure: Current   Smokeless tobacco: Never  Vaping Use   Vaping status: Never Used  Substance and Sexual Activity   Alcohol use: Yes   Drug use: No   Sexual activity: Not Currently  Other Topics Concern   Not on file  Social History Narrative   Not on file   Social Drivers of Health   Financial Resource Strain: Not on file  Food Insecurity: Food Insecurity Present (01/14/2023)   Hunger Vital Sign    Worried About Running Out of Food in the Last Year: Sometimes true    Ran Out of Food in the Last Year: Sometimes true  Transportation Needs: Unmet Transportation Needs (01/14/2023)   PRAPARE - Administrator, Civil Service (Medical): Yes    Lack of Transportation (Non-Medical): Yes  Physical Activity: Not on file  Stress: Not on file  Social Connections: Unknown (11/12/2022)   Received from [facility]   Social Network    Social Network: Not on file    Hospital Course:   Patient pleasant throughout hospitalization and remained goal oriented to Etoh cessation. Patient had some anxiety on day of discharge due to worry about being accepted to [facility], but was looking forward to programming.     During the patient's hospitalization, patient had extensive initial psychiatric evaluation, and follow-up psychiatric evaluations every day.   Psychiatric diagnoses provided upon initial assessment: Substance Induced Mood D/O, Etoh use d/o ,  severe   Patient's psychiatric medications were adjusted on admission:  - Started on Zoloft 25mg  daily - Started CIWA w/ PRN Ativan for Etoh detox   PRN -Tylenol 650mg  q6h, pain -Maalox 30ml q4h, indigestion -Atarax 25mg  TID, anxiety -Milk of Mag 30mL, constipation -Trazodone 50mg  QHS, insomnia    During the hospitalization, other adjustments were made to the patient's psychiatric medication regimen:  - Titrated  Zoloft up to 100mg  daily - Patient did not require PRN Ativan, CIWA dc'd   Gradually, patient started adjusting to milieu.   Patient's care was discussed during the interdisciplinary team meeting every day during the hospitalization.   The patient denied having side effects to prescribed psychiatric medication.   The patient reports their target psychiatric symptoms of depressed mood, irritability, insomnia and general anxiety responded well to the psychiatric medications, and the patient reports overall benefit other psychiatric hospitalization. Supportive psychotherapy was provided to the patient. The patient also participated in regular group therapy while admitted.    Labs were reviewed with the patient, and abnormal results were discussed with the patient.   The patient denied having suicidal thoughts more than 48 hours prior to discharge.  Patient denies having homicidal thoughts.  Patient denies having auditory hallucinations.  Patient denies any visual hallucinations.  Patient denies having paranoid thoughts.   The patient is able to verbalize their individual safety plan to this provider.   It is recommended to the patient to continue psychiatric medications as prescribed, after discharge from the hospital.     It is recommended to the patient to follow up with your outpatient psychiatric provider and PCP.   Discussed with the patient, the impact of alcohol, drugs, tobacco have been there overall psychiatric and medical wellbeing, and total abstinence from substance use was recommended the patient.     Physical Findings: AIMS:  , ,  ,  ,    CIWA:  CIWA-Ar Total: 1 COWS:     Musculoskeletal: Strength & Muscle Tone: within normal limits Gait & Station: normal Patient leans: N/A   Psychiatric Specialty Exam:  Presentation  General Appearance:  Appropriate for Environment  Eye Contact: Good  Speech: Clear and Coherent  Speech  Volume: Normal  Handedness: Right   Mood and Affect  Mood: Anxious  Affect: Appropriate   Thought Process  Thought Processes: Coherent  Descriptions of Associations:Intact  Orientation:Full (Time, Place and Person)  Thought Content:Logical  History of Schizophrenia/Schizoaffective disorder:No data recorded Duration of Psychotic Symptoms:No data recorded Hallucinations:Hallucinations: None  Ideas of Reference:None  Suicidal Thoughts:Suicidal Thoughts: No  Homicidal Thoughts:Homicidal Thoughts: No   Sensorium  Memory: Immediate Good; Recent Good  Judgment: Good  Insight: Good   Executive Functions  Concentration: Good  Attention Span: Good  Recall: Good  Fund of Knowledge: Good  Language: Good   Psychomotor Activity  Psychomotor Activity: Psychomotor Activity: Normal   Assets  Assets: Communication Skills; Desire for Improvement   Sleep  Sleep: Sleep: Fair    Physical Exam: Physical Exam Constitutional:      Appearance: Normal appearance.  HENT:     Head: Normocephalic and atraumatic.  Pulmonary:     Effort: Pulmonary effort is normal.  Neurological:     Mental Status: He is alert and oriented to person, place, and time.    ROS Blood pressure 106/82, pulse 76, temperature 98.5 F (36.9 C), temperature source Oral, resp. rate 16, height 5\' 7"  (1.702 m), weight 63.6 kg, SpO2 100%. Body mass index is 21.96 kg/m.  Social History   Tobacco Use  Smoking Status Never   Passive exposure: Current  Smokeless Tobacco Never   Tobacco Cessation:  N/A, patient does not currently use tobacco products   Blood Alcohol level:  Lab Results  Component Value Date   ETH 147 (H) 01/13/2023   ETH 20 (H) 01/09/2023    Metabolic Disorder Labs:  Lab Results  Component Value Date   HGBA1C 6.0 (H) 03/06/2013   MPG 126 (H) 03/06/2013   No results found for: "PROLACTIN" Lab Results  Component Value Date   CHOL 136 03/06/2013    TRIG 93 03/06/2013   HDL 36 (L) 03/06/2013   CHOLHDL 3.8 03/06/2013   VLDL 19 03/06/2013   LDLCALC 81 03/06/2013    See Psychiatric Specialty Exam and Suicide Risk Assessment completed by Attending Physician prior to discharge.  Discharge destination:  Other:  [facility]  Is patient on multiple antipsychotic therapies at discharge:  No   Has Patient had three or more failed trials of antipsychotic monotherapy by history:  No  Recommended Plan for Multiple Antipsychotic Therapies: NA   Allergies as of 01/22/2023   No Known Allergies      Medication List     STOP taking these medications    chlordiazePOXIDE 25 MG capsule Commonly known as: LIBRIUM       TAKE these medications      Indication  cyanocobalamin 1000 MCG tablet Take 1 tablet (1,000 mcg total) by mouth daily. Start taking on: January 23, 2023  Indication: Inadequate Vitamin B12   hydrOXYzine 25 MG tablet Commonly known as: ATARAX Take 1 tablet (25 mg total) by mouth 3 (three) times daily as needed for anxiety.  Indication: Feeling Anxious   multivitamin with minerals Tabs tablet Take 1 tablet by mouth daily. Start taking on: January 23, 2023  Indication: Vitamin Deficiency   sertraline 100 MG tablet Commonly known as: ZOLOFT Take 1 tablet (100 mg total) by mouth daily. Start taking on: January 23, 2023  Indication: Major Depressive Disorder   thiamine 100 MG tablet Commonly known as: Vitamin B-1 Take 1 tablet (100 mg total) by mouth daily. Start taking on: January 23, 2023  Indication: Deficiency of Vitamin B1   traZODone 50 MG tablet Commonly known as: DESYREL Take 1 tablet (50 mg total) by mouth at bedtime as needed for sleep.  Indication: Trouble Sleeping   Vitamin D (Ergocalciferol) 1.25 MG (50000 UNIT) Caps capsule Commonly known as: DRISDOL Take 1 capsule (50,000 Units total) by mouth every 7 (seven) days. Start taking on: January 23, 2023  Indication: Vitamin D Deficiency         Follow-up Information     [facility]. Go on 01/26/2023.   Specialty: Behavioral Health Why: Please go to this provider for medication management services on 01/26/23 at 7:00 am.  You may also go Monday through Friday, arrive by 7:00 am for same day service. Contact information: [address] [place] [address] [phone]        Services, [facility] Follow up.   Why: Referral made Contact information: [address] [place] [address] [phone]         Abusers, [facility] Follow up.   Why: Toll-free Phone: [phone] Email: [email or web address] Contact information: [address] [place] [address] [phone]                 Follow-up recommendations:    Activity: as tolerated  Diet: heart healthy   Other: -Follow-up with your outpatient psychiatric provider -instructions on appointment date, time, and address (location) are provided to you in discharge paperwork.   -Take your psychiatric medications as prescribed at discharge - instructions are provided to you in the discharge paperwork   -Follow-up with outpatient primary care doctor and other specialists -for management of preventative medicine and chronic medical disease: Vit D deficiency, Vit B12 def, Elevated Cr, Anemia   -Testing: Follow-up with outpatient provider for abnormal lab results:  Vit D: 11.68 Vit B12: 153 BMP: Cr 1.69 Hgb: CBC 12.3/Hct 37.3   -If you are prescribed an atypical antipsychotic medication, we recommend that your outpatient psychiatrist follow routine screening for side effects within 3 months of discharge, including monitoring: AIMS scale, height, weight, blood pressure, fasting lipid panel, HbA1c, and fasting blood sugar.    -Recommend total abstinence from alcohol, tobacco, and other illicit drug use at discharge.    -If your psychiatric symptoms recur, worsen, or if  you have side effects to your psychiatric medications, call your outpatient psychiatric provider, 911, [phone] or go to the nearest emergency department.   -If suicidal thoughts occur, immediately call your outpatient psychiatric provider, 911, [phone] or go to the nearest emergency department.   Signed: PGY-4 Bobbye Morton, MD 01/22/2023, 10:04 AM

## 2023-01-22 NOTE — BHH Suicide Risk Assessment (Signed)
Suicide Risk Assessment  Discharge Assessment    [facility] Discharge Suicide Risk Assessment   Principal Problem: Substance induced mood disorder (HCC) Discharge Diagnoses: Principal Problem:   Substance induced mood disorder (HCC) Active Problems:   Right bundle branch block (RBBB) on electrocardiogram (ECG)   Alcohol use disorder, severe, dependence (HCC)   Alcohol withdrawal (HCC)  Seth Jordan is an 52 y.o. male who  has a past medical history of B12 deficiency, Bipolar disease, chronic (HCC), Schizo affective schizophrenia (HCC), and Vitamin D deficiency.  He presented on 01/14/2023  2:17 PM for Substance induced mood disorder (HCC).   During the patient's hospitalization, patient had extensive initial psychiatric evaluation, and follow-up psychiatric evaluations every day.  Psychiatric diagnoses provided upon initial assessment: Substance Induced Mood D/O, Etoh use d/o ,severe  Patient's psychiatric medications were adjusted on admission:  - Started on Zoloft 25mg  daily - Started CIWA w/ PRN Ativan for Etoh detox  PRN -Tylenol 650mg  q6h, pain -Maalox 30ml q4h, indigestion -Atarax 25mg  TID, anxiety -Milk of Mag 30mL, constipation -Trazodone 50mg  QHS, insomnia   During the hospitalization, other adjustments were made to the patient's psychiatric medication regimen:  - Titrated Zoloft up to 100mg  daily - Patient did not require PRN Ativan, CIWA dc'd  Gradually, patient started adjusting to milieu.   Patient's care was discussed during the interdisciplinary team meeting every day during the hospitalization.  The patient denied having side effects to prescribed psychiatric medication.  The patient reports their target psychiatric symptoms of depressed mood, irritability, insomnia and general anxiety responded well to the psychiatric medications, and the patient reports overall benefit other psychiatric hospitalization. Supportive psychotherapy was provided to the patient. The  patient also participated in regular group therapy while admitted.   Labs were reviewed with the patient, and abnormal results were discussed with the patient.  The patient denied having suicidal thoughts more than 48 hours prior to discharge.  Patient denies having homicidal thoughts.  Patient denies having auditory hallucinations.  Patient denies any visual hallucinations.  Patient denies having paranoid thoughts.  The patient is able to verbalize their individual safety plan to this provider.  It is recommended to the patient to continue psychiatric medications as prescribed, after discharge from the hospital.    It is recommended to the patient to follow up with your outpatient psychiatric provider and PCP.  Discussed with the patient, the impact of alcohol, drugs, tobacco have been there overall psychiatric and medical wellbeing, and total abstinence from substance use was recommended the patient.    Total Time spent with patient: 20 minutes  Musculoskeletal: Strength & Muscle Tone: within normal limits Gait & Station: normal Patient leans: N/A  Psychiatric Specialty Exam  Presentation  General Appearance:  Appropriate for Environment  Eye Contact: Good  Speech: Clear and Coherent  Speech Volume: Normal  Handedness: Right   Mood and Affect  Mood: Anxious  Duration of Depression Symptoms: No data recorded Affect: Appropriate   Thought Process  Thought Processes: Coherent  Descriptions of Associations:Intact  Orientation:Full (Time, Place and Person)  Thought Content:Logical  History of Schizophrenia/Schizoaffective disorder:No data recorded Duration of Psychotic Symptoms:No data recorded Hallucinations:Hallucinations: None  Ideas of Reference:None  Suicidal Thoughts:Suicidal Thoughts: No  Homicidal Thoughts:Homicidal Thoughts: No   Sensorium  Memory: Immediate Good; Recent Good  Judgment: Good  Insight: Good   Executive Functions   Concentration: Good  Attention Span: Good  Recall: Good  Fund of Knowledge: Good  Language: Good   Psychomotor Activity  Psychomotor Activity:  Psychomotor Activity: Normal   Assets  Assets: Communication Skills; Desire for Improvement   Sleep  Sleep: Sleep: Fair   Physical Exam: Physical Exam Constitutional:      Appearance: Normal appearance.  HENT:     Head: Normocephalic and atraumatic.  Pulmonary:     Effort: Pulmonary effort is normal.  Neurological:     Mental Status: He is alert and oriented to person, place, and time.    Review of Systems  Psychiatric/Behavioral:  Negative for depression, hallucinations and suicidal ideas. The patient is nervous/anxious.    Blood pressure 106/82, pulse 76, temperature 98.5 F (36.9 C), temperature source Oral, resp. rate 16, height 5\' 7"  (1.702 m), weight 63.6 kg, SpO2 100%. Body mass index is 21.96 kg/m.  Mental Status Per Nursing Assessment::   On Admission:  Suicidal ideation indicated by patient  Demographic Factors:  Male, Low socioeconomic status, and Unemployed  Loss Factors: Financial problems/change in socioeconomic status  Historical Factors: NA  Risk Reduction Factors:   Going to rehab facility  Continued Clinical Symptoms:  NA, positive anxiety about going to rehab  Cognitive Features That Contribute To Risk:  None    Suicide Risk:  Mild:  Suicidal ideation of limited frequency, intensity, duration, and specificity.  There are no identifiable plans, no associated intent, mild dysphoria and related symptoms, good self-control (both objective and subjective assessment), few other risk factors, and identifiable protective factors, including available and accessible social support.   Follow-up Information     [facility]. Go on 01/22/2023.   Specialty: Behavioral Health Why: Please go to this provider for medication management services on 01/22/23 at 7:00 am.   You may also go Monday through Friday, arrive by 7:00 am for same day service. Contact information: [address] [place] [address] [phone]        Services, [facility] Follow up.   Why: Referral made Contact information: [address] [place] [address] [phone]         Abusers, [facility] Follow up.   Why: Toll-free Phone: [phone] Email: [email or web address] Contact information: [address] [place] [address] [phone]                 Plan Of Care/Follow-up recommendations:  Activity: as tolerated  Diet: heart healthy  Other: -Follow-up with your outpatient psychiatric provider -instructions on appointment date, time, and address (location) are provided to you in discharge paperwork.  -Take your psychiatric medications as prescribed at discharge - instructions are provided to you in the discharge paperwork  -Follow-up with outpatient primary care doctor and other specialists -for management of preventative medicine and chronic medical disease: Vit D deficiency, Vit B12 def, Elevated Cr, Anemia  -Testing: Follow-up with outpatient provider for abnormal lab results:  Vit D: 11.68 Vit B12: 153 BMP: Cr 1.69 Hgb: CBC 12.3/Hct 37.3  -If you are prescribed an atypical antipsychotic medication, we recommend that your outpatient psychiatrist follow routine screening for side effects within 3 months of discharge, including monitoring: AIMS scale, height, weight, blood pressure, fasting lipid panel, HbA1c, and fasting blood sugar.   -Recommend total abstinence from alcohol, tobacco, and other illicit drug use at discharge.   -If your psychiatric symptoms recur, worsen, or if you have side effects to your psychiatric medications, call your outpatient psychiatric provider, 911, [phone] or go to the nearest emergency department.  -If suicidal thoughts occur, immediately call your outpatient  psychiatric provider, 911, 988  or go to the nearest emergency department.   PGY-4 Bobbye Morton, MD 01/22/2023, 8:46 AM

## 2023-01-22 NOTE — Progress Notes (Signed)
  [facility] Adult Case Management Discharge Plan :  Will you be returning to the same living situation after discharge:  Yes,  pt will be discharged to [place], [place] At discharge, do you have transportation home?: Yes,  pt will be transported via [facility] Do you have the ability to pay for your medications: Yes,  pt has been given sample medications  Release of information consent forms completed and in the chart;  Patient's signature needed at discharge.  Patient to Follow up at:  Follow-up Information     [facility]. Go on 01/26/2023.   Specialty: Behavioral Health Why: Please go to this provider for medication management and therapy after completion of program thru [facility]. Contact information: [address] [place] [address] [phone]        Abusers, [address] [facility] Follow up.   Why: You have been accepted to [facility], and you will be transported via [facility]. Contact information: [address] [place] [address] [phone]                 Next level of care provider has access to [facility] Link:yes  Safety Planning and Suicide Prevention discussed: Yes,  Safety Planning completed with pt.     Has patient been referred to the Quitline?: Patient refused referral for treatment  Patient has been referred for addiction treatment: Pt has been accepted at [facility] for residential treatment.   Jaziyah Gradel, Candace Cruise, LCSWA 01/22/2023, 10:49 AM

## 2023-01-22 NOTE — Progress Notes (Signed)
   01/22/23 0544  15 Minute Checks  Location Bedroom  Visual Appearance Calm  Behavior Sleeping  Sleep (Behavioral Health Patients Only)  Calculate sleep? (Click Yes once per 24 hr at 0600 safety check) Yes  Documented sleep last 24 hours 8

## 2023-01-22 NOTE — [facility] Suicide Risk Assessment (Signed)
[facility] Discharge Suicide Risk Assessment   Principal Problem: Substance induced mood disorder (HCC) Discharge Diagnoses: Principal Problem:   Substance induced mood disorder (HCC) Active Problems:   Right bundle branch block (RBBB) on electrocardiogram (ECG)   Alcohol use disorder, severe, dependence (HCC)   Alcohol withdrawal (HCC)   Total Time spent with patient: 30 minutes  Musculoskeletal: Strength & Muscle Tone: within normal limits Gait & Station: normal Patient leans: N/A  Psychiatric Specialty Exam  Presentation  General Appearance:  Appropriate for Environment  Eye Contact: Good  Speech: Clear and Coherent  Speech Volume: Normal  Handedness: Right   Mood and Affect  Mood: Anxious  Duration of Depression Symptoms: No data recorded Affect: Appropriate   Thought Process  Thought Processes: Coherent  Descriptions of Associations:Intact  Orientation:Full (Time, Place and Person)  Thought Content:Logical  History of Schizophrenia/Schizoaffective disorder:No data recorded Duration of Psychotic Symptoms:No data recorded Hallucinations:Hallucinations: None  Ideas of Reference:None  Suicidal Thoughts:Suicidal Thoughts: No  Homicidal Thoughts:Homicidal Thoughts: No   Sensorium  Memory: Immediate Good; Recent Good  Judgment: Good  Insight: Good   Executive Functions  Concentration: Good  Attention Span: Good  Recall: Good  Fund of Knowledge: Good  Language: Good   Psychomotor Activity  Psychomotor Activity: Psychomotor Activity: Normal   Assets  Assets: Communication Skills; Desire for Improvement   Sleep  Sleep: Sleep: Fair   Physical Exam: Physical Exam Constitutional:      Appearance: Normal appearance.  Neurological:     General: No focal deficit present.     Mental Status: He is alert and oriented to person, place, and time. Mental status is at baseline.  Psychiatric:        Mood and Affect: Mood  normal.        Behavior: Behavior normal.    ROS Blood pressure 106/82, pulse 76, temperature 98.5 F (36.9 C), temperature source Oral, resp. rate 16, height 5\' 7"  (1.702 m), weight 63.6 kg, SpO2 100%. Body mass index is 21.96 kg/m.  Mental Status Per Nursing Assessment::   On Admission:  Suicidal ideation indicated by patient  Demographic Factors:  Male, Low socioeconomic status, and Unemployed  Loss Factors: NA  Historical Factors: Impulsivity  Risk Reduction Factors:   NA  Continued Clinical Symptoms:  Alcohol/Substance Abuse/Dependencies  Cognitive Features That Contribute To Risk:  None    Suicide Risk:  Mild:  Suicidal ideation of limited frequency, intensity, duration, and specificity.  There are no identifiable plans, no associated intent, mild dysphoria and related symptoms, good self-control (both objective and subjective assessment), few other risk factors, and identifiable protective factors, including available and accessible social support.   Follow-up Information     [facility]. Go on 01/26/2023.   Specialty: Behavioral Health Why: Please go to this provider for medication management services on 01/26/23 at 7:00 am.  You may also go Monday through Friday, arrive by 7:00 am for same day service. Contact information: [address] [place] [address] [phone]        Services, [facility] Follow up.   Why: Referral made Contact information: [address] [place] [address] [phone]         Abusers, [facility] Follow up.   Why: Toll-free Phone: [phone] Email: [email or web address] [email or web address] Contact information: [address] [place] [address] [phone]                 Plan Of Care/Follow-up recommendations:  Activity:  AS tolerated. Abstain from drugs and alcohol and go to long term rehab.  Rex Kras, MD 01/22/2023, 9:50 AM

## 2023-01-22 NOTE — Progress Notes (Signed)
  Patient presented with mild anxiety.  Per patient, he is awaiting a final response from a long-term treatment facility. Patient requested help with sleep.  Administered PRN Hydroxyzine and Trazodone per [facility] per patient request.

## 2023-01-22 NOTE — Group Note (Signed)
Date:  01/22/2023 Time:  11:01 AM  Group Topic/Focus:  Goals Group:   The focus of this group is to help patients establish daily goals to achieve during treatment and discuss how the patient can incorporate goal setting into their daily lives to aide in recovery. Orientation:   The focus of this group is to educate the patient on the purpose and policies of crisis stabilization and provide a format to answer questions about their admission.  The group details unit policies and expectations of patients while admitted.    Participation Level:  Active  Participation Quality:  Appropriate  Affect:  Appropriate  Cognitive:  Appropriate  Insight: Appropriate  Engagement in Group:  Engaged  Modes of Intervention:  Discussion  Additional Comments:    Masey Scheiber D Daneesha Quinteros 01/22/2023, 11:01 AM
